# Patient Record
Sex: Female | Born: 1940 | Race: White | Hispanic: No | Marital: Married | Smoking: Never smoker
Health system: Southern US, Community
[De-identification: ages and names within clinical notes are randomized; demographics above are authoritative.]

## PROBLEM LIST (undated history)

## (undated) DIAGNOSIS — F329 Major depressive disorder, single episode, unspecified: Secondary | ICD-10-CM

## (undated) DIAGNOSIS — K219 Gastro-esophageal reflux disease without esophagitis: Secondary | ICD-10-CM

## (undated) DIAGNOSIS — M7072 Other bursitis of hip, left hip: Secondary | ICD-10-CM

## (undated) DIAGNOSIS — Z972 Presence of dental prosthetic device (complete) (partial): Secondary | ICD-10-CM

## (undated) DIAGNOSIS — E7800 Pure hypercholesterolemia, unspecified: Secondary | ICD-10-CM

## (undated) DIAGNOSIS — M7989 Other specified soft tissue disorders: Secondary | ICD-10-CM

## (undated) DIAGNOSIS — M810 Age-related osteoporosis without current pathological fracture: Secondary | ICD-10-CM

## (undated) DIAGNOSIS — F32A Depression, unspecified: Secondary | ICD-10-CM

## (undated) DIAGNOSIS — J302 Other seasonal allergic rhinitis: Secondary | ICD-10-CM

## (undated) DIAGNOSIS — J90 Pleural effusion, not elsewhere classified: Secondary | ICD-10-CM

## (undated) DIAGNOSIS — I10 Essential (primary) hypertension: Secondary | ICD-10-CM

## (undated) HISTORY — PX: OVARIAN CYST REMOVAL: SHX89

## (undated) HISTORY — PX: BREAST SURGERY: SHX581

## (undated) HISTORY — PX: TONSILLECTOMY: SUR1361

## (undated) HISTORY — PX: CATARACT EXTRACTION: SUR2

## (undated) HISTORY — PX: BREAST BIOPSY: SHX20

---

## 1998-06-16 DIAGNOSIS — I10 Essential (primary) hypertension: Secondary | ICD-10-CM | POA: Insufficient documentation

## 2004-01-10 ENCOUNTER — Ambulatory Visit: Payer: Self-pay | Admitting: Urology

## 2005-01-12 ENCOUNTER — Ambulatory Visit: Payer: Self-pay | Admitting: Family Medicine

## 2005-09-08 DIAGNOSIS — K5909 Other constipation: Secondary | ICD-10-CM | POA: Insufficient documentation

## 2005-09-16 ENCOUNTER — Ambulatory Visit: Payer: Self-pay | Admitting: Family Medicine

## 2005-10-04 ENCOUNTER — Ambulatory Visit: Payer: Self-pay | Admitting: Gastroenterology

## 2005-10-22 ENCOUNTER — Ambulatory Visit: Payer: Self-pay | Admitting: General Surgery

## 2005-10-22 ENCOUNTER — Other Ambulatory Visit: Payer: Self-pay

## 2005-10-29 ENCOUNTER — Ambulatory Visit: Payer: Self-pay | Admitting: General Surgery

## 2006-03-01 DIAGNOSIS — J90 Pleural effusion, not elsewhere classified: Secondary | ICD-10-CM

## 2006-03-01 HISTORY — PX: TOTAL HIP REVISION: SHX763

## 2006-03-01 HISTORY — PX: JOINT REPLACEMENT: SHX530

## 2006-03-01 HISTORY — DX: Pleural effusion, not elsewhere classified: J90

## 2006-07-07 ENCOUNTER — Ambulatory Visit: Payer: Self-pay | Admitting: Family Medicine

## 2007-03-02 DIAGNOSIS — K589 Irritable bowel syndrome without diarrhea: Secondary | ICD-10-CM | POA: Insufficient documentation

## 2007-07-11 ENCOUNTER — Ambulatory Visit: Payer: Self-pay | Admitting: Internal Medicine

## 2007-09-20 ENCOUNTER — Ambulatory Visit: Payer: Self-pay | Admitting: Family Medicine

## 2007-10-22 ENCOUNTER — Ambulatory Visit: Payer: Self-pay | Admitting: Family Medicine

## 2008-01-27 ENCOUNTER — Inpatient Hospital Stay: Payer: Self-pay | Admitting: Specialist

## 2008-08-26 ENCOUNTER — Ambulatory Visit: Payer: Self-pay | Admitting: Family Medicine

## 2008-11-11 ENCOUNTER — Ambulatory Visit: Payer: Self-pay | Admitting: Family Medicine

## 2009-05-28 ENCOUNTER — Ambulatory Visit: Payer: Self-pay | Admitting: Ophthalmology

## 2009-11-17 ENCOUNTER — Ambulatory Visit: Payer: Self-pay | Admitting: Family Medicine

## 2011-01-04 ENCOUNTER — Ambulatory Visit: Payer: Self-pay | Admitting: Family Medicine

## 2012-01-05 ENCOUNTER — Ambulatory Visit: Payer: Self-pay | Admitting: Family Medicine

## 2012-04-11 LAB — CBC AND DIFFERENTIAL
HEMATOCRIT: 40 % (ref 36–46)
HEMOGLOBIN: 13.3 g/dL (ref 12.0–16.0)
Platelets: 200 10*3/uL (ref 150–399)
WBC: 7.5 10*3/mL

## 2012-04-11 LAB — HEPATIC FUNCTION PANEL: ALT: 25 U/L (ref 7–35)

## 2012-11-15 DIAGNOSIS — Z96649 Presence of unspecified artificial hip joint: Secondary | ICD-10-CM | POA: Insufficient documentation

## 2013-01-05 DIAGNOSIS — G629 Polyneuropathy, unspecified: Secondary | ICD-10-CM | POA: Insufficient documentation

## 2013-03-23 ENCOUNTER — Ambulatory Visit: Payer: Self-pay | Admitting: Family Medicine

## 2013-05-02 LAB — LIPID PANEL
CHOLESTEROL: 185 mg/dL (ref 0–200)
HDL: 42 mg/dL (ref 35–70)
LDL CALC: 88 mg/dL
Triglycerides: 275 mg/dL — AB (ref 40–160)

## 2013-05-02 LAB — BASIC METABOLIC PANEL
BUN: 16 mg/dL (ref 4–21)
CREATININE: 0.7 mg/dL (ref 0.5–1.1)
Glucose: 102 mg/dL
Potassium: 4.4 mmol/L (ref 3.4–5.3)
Sodium: 142 mmol/L (ref 137–147)

## 2013-05-28 DIAGNOSIS — M47816 Spondylosis without myelopathy or radiculopathy, lumbar region: Secondary | ICD-10-CM | POA: Insufficient documentation

## 2014-06-12 ENCOUNTER — Ambulatory Visit
Admit: 2014-06-12 | Disposition: A | Discharge status: Home / Self Care | Payer: Self-pay | Attending: Family Medicine | Admitting: Family Medicine

## 2014-06-12 DIAGNOSIS — S32512A Fracture of superior rim of left pubis, initial encounter for closed fracture: Secondary | ICD-10-CM | POA: Diagnosis not present

## 2014-06-18 DIAGNOSIS — S32509A Unspecified fracture of unspecified pubis, initial encounter for closed fracture: Secondary | ICD-10-CM | POA: Insufficient documentation

## 2014-06-24 DIAGNOSIS — R2689 Other abnormalities of gait and mobility: Secondary | ICD-10-CM | POA: Diagnosis not present

## 2014-06-24 DIAGNOSIS — Z96642 Presence of left artificial hip joint: Secondary | ICD-10-CM | POA: Diagnosis not present

## 2014-06-24 DIAGNOSIS — I10 Essential (primary) hypertension: Secondary | ICD-10-CM | POA: Diagnosis not present

## 2014-06-24 DIAGNOSIS — M47816 Spondylosis without myelopathy or radiculopathy, lumbar region: Secondary | ICD-10-CM | POA: Diagnosis not present

## 2014-06-24 DIAGNOSIS — M810 Age-related osteoporosis without current pathological fracture: Secondary | ICD-10-CM | POA: Diagnosis not present

## 2014-06-24 DIAGNOSIS — M545 Low back pain: Secondary | ICD-10-CM | POA: Diagnosis not present

## 2014-06-24 DIAGNOSIS — S32502A Unspecified fracture of left pubis, initial encounter for closed fracture: Secondary | ICD-10-CM | POA: Diagnosis not present

## 2014-06-26 DIAGNOSIS — I10 Essential (primary) hypertension: Secondary | ICD-10-CM | POA: Diagnosis not present

## 2014-06-26 DIAGNOSIS — M47816 Spondylosis without myelopathy or radiculopathy, lumbar region: Secondary | ICD-10-CM | POA: Diagnosis not present

## 2014-06-26 DIAGNOSIS — M810 Age-related osteoporosis without current pathological fracture: Secondary | ICD-10-CM | POA: Diagnosis not present

## 2014-06-26 DIAGNOSIS — S32502A Unspecified fracture of left pubis, initial encounter for closed fracture: Secondary | ICD-10-CM | POA: Diagnosis not present

## 2014-06-26 DIAGNOSIS — R2689 Other abnormalities of gait and mobility: Secondary | ICD-10-CM | POA: Diagnosis not present

## 2014-06-26 DIAGNOSIS — Z96642 Presence of left artificial hip joint: Secondary | ICD-10-CM | POA: Diagnosis not present

## 2014-06-26 DIAGNOSIS — M545 Low back pain: Secondary | ICD-10-CM | POA: Diagnosis not present

## 2014-06-27 DIAGNOSIS — S3210XA Unspecified fracture of sacrum, initial encounter for closed fracture: Secondary | ICD-10-CM | POA: Insufficient documentation

## 2014-06-28 DIAGNOSIS — I10 Essential (primary) hypertension: Secondary | ICD-10-CM | POA: Diagnosis not present

## 2014-06-28 DIAGNOSIS — M47816 Spondylosis without myelopathy or radiculopathy, lumbar region: Secondary | ICD-10-CM | POA: Diagnosis not present

## 2014-06-28 DIAGNOSIS — S32502A Unspecified fracture of left pubis, initial encounter for closed fracture: Secondary | ICD-10-CM | POA: Diagnosis not present

## 2014-06-28 DIAGNOSIS — M545 Low back pain: Secondary | ICD-10-CM | POA: Diagnosis not present

## 2014-06-28 DIAGNOSIS — R2689 Other abnormalities of gait and mobility: Secondary | ICD-10-CM | POA: Diagnosis not present

## 2014-06-28 DIAGNOSIS — Z96642 Presence of left artificial hip joint: Secondary | ICD-10-CM | POA: Diagnosis not present

## 2014-06-28 DIAGNOSIS — M810 Age-related osteoporosis without current pathological fracture: Secondary | ICD-10-CM | POA: Diagnosis not present

## 2014-07-01 DIAGNOSIS — M545 Low back pain: Secondary | ICD-10-CM | POA: Diagnosis not present

## 2014-07-01 DIAGNOSIS — Z96642 Presence of left artificial hip joint: Secondary | ICD-10-CM | POA: Diagnosis not present

## 2014-07-01 DIAGNOSIS — M47816 Spondylosis without myelopathy or radiculopathy, lumbar region: Secondary | ICD-10-CM | POA: Diagnosis not present

## 2014-07-01 DIAGNOSIS — S32502A Unspecified fracture of left pubis, initial encounter for closed fracture: Secondary | ICD-10-CM | POA: Diagnosis not present

## 2014-07-01 DIAGNOSIS — M810 Age-related osteoporosis without current pathological fracture: Secondary | ICD-10-CM | POA: Diagnosis not present

## 2014-07-01 DIAGNOSIS — I10 Essential (primary) hypertension: Secondary | ICD-10-CM | POA: Diagnosis not present

## 2014-07-01 DIAGNOSIS — R2689 Other abnormalities of gait and mobility: Secondary | ICD-10-CM | POA: Diagnosis not present

## 2014-07-03 DIAGNOSIS — S32502A Unspecified fracture of left pubis, initial encounter for closed fracture: Secondary | ICD-10-CM | POA: Diagnosis not present

## 2014-07-03 DIAGNOSIS — M47816 Spondylosis without myelopathy or radiculopathy, lumbar region: Secondary | ICD-10-CM | POA: Diagnosis not present

## 2014-07-03 DIAGNOSIS — Z96642 Presence of left artificial hip joint: Secondary | ICD-10-CM | POA: Diagnosis not present

## 2014-07-03 DIAGNOSIS — M545 Low back pain: Secondary | ICD-10-CM | POA: Diagnosis not present

## 2014-07-03 DIAGNOSIS — M810 Age-related osteoporosis without current pathological fracture: Secondary | ICD-10-CM | POA: Diagnosis not present

## 2014-07-03 DIAGNOSIS — I10 Essential (primary) hypertension: Secondary | ICD-10-CM | POA: Diagnosis not present

## 2014-07-03 DIAGNOSIS — R2689 Other abnormalities of gait and mobility: Secondary | ICD-10-CM | POA: Diagnosis not present

## 2014-07-05 DIAGNOSIS — M810 Age-related osteoporosis without current pathological fracture: Secondary | ICD-10-CM | POA: Diagnosis not present

## 2014-07-05 DIAGNOSIS — S32502A Unspecified fracture of left pubis, initial encounter for closed fracture: Secondary | ICD-10-CM | POA: Diagnosis not present

## 2014-07-05 DIAGNOSIS — I10 Essential (primary) hypertension: Secondary | ICD-10-CM | POA: Diagnosis not present

## 2014-07-05 DIAGNOSIS — Z96642 Presence of left artificial hip joint: Secondary | ICD-10-CM | POA: Diagnosis not present

## 2014-07-05 DIAGNOSIS — R2689 Other abnormalities of gait and mobility: Secondary | ICD-10-CM | POA: Diagnosis not present

## 2014-07-05 DIAGNOSIS — M47816 Spondylosis without myelopathy or radiculopathy, lumbar region: Secondary | ICD-10-CM | POA: Diagnosis not present

## 2014-07-05 DIAGNOSIS — M545 Low back pain: Secondary | ICD-10-CM | POA: Diagnosis not present

## 2014-07-08 DIAGNOSIS — S32502A Unspecified fracture of left pubis, initial encounter for closed fracture: Secondary | ICD-10-CM | POA: Diagnosis not present

## 2014-07-08 DIAGNOSIS — M545 Low back pain: Secondary | ICD-10-CM | POA: Diagnosis not present

## 2014-07-08 DIAGNOSIS — M47816 Spondylosis without myelopathy or radiculopathy, lumbar region: Secondary | ICD-10-CM | POA: Diagnosis not present

## 2014-07-08 DIAGNOSIS — R2689 Other abnormalities of gait and mobility: Secondary | ICD-10-CM | POA: Diagnosis not present

## 2014-07-08 DIAGNOSIS — I10 Essential (primary) hypertension: Secondary | ICD-10-CM | POA: Diagnosis not present

## 2014-07-08 DIAGNOSIS — M810 Age-related osteoporosis without current pathological fracture: Secondary | ICD-10-CM | POA: Diagnosis not present

## 2014-07-08 DIAGNOSIS — Z96642 Presence of left artificial hip joint: Secondary | ICD-10-CM | POA: Diagnosis not present

## 2014-07-10 DIAGNOSIS — S32502A Unspecified fracture of left pubis, initial encounter for closed fracture: Secondary | ICD-10-CM | POA: Diagnosis not present

## 2014-07-10 DIAGNOSIS — I10 Essential (primary) hypertension: Secondary | ICD-10-CM | POA: Diagnosis not present

## 2014-07-10 DIAGNOSIS — R2689 Other abnormalities of gait and mobility: Secondary | ICD-10-CM | POA: Diagnosis not present

## 2014-07-10 DIAGNOSIS — Z96642 Presence of left artificial hip joint: Secondary | ICD-10-CM | POA: Diagnosis not present

## 2014-07-10 DIAGNOSIS — M545 Low back pain: Secondary | ICD-10-CM | POA: Diagnosis not present

## 2014-07-10 DIAGNOSIS — M47816 Spondylosis without myelopathy or radiculopathy, lumbar region: Secondary | ICD-10-CM | POA: Diagnosis not present

## 2014-07-10 DIAGNOSIS — M810 Age-related osteoporosis without current pathological fracture: Secondary | ICD-10-CM | POA: Diagnosis not present

## 2014-07-12 DIAGNOSIS — M810 Age-related osteoporosis without current pathological fracture: Secondary | ICD-10-CM | POA: Diagnosis not present

## 2014-07-12 DIAGNOSIS — R2689 Other abnormalities of gait and mobility: Secondary | ICD-10-CM | POA: Diagnosis not present

## 2014-07-12 DIAGNOSIS — M545 Low back pain: Secondary | ICD-10-CM | POA: Diagnosis not present

## 2014-07-12 DIAGNOSIS — I10 Essential (primary) hypertension: Secondary | ICD-10-CM | POA: Diagnosis not present

## 2014-07-12 DIAGNOSIS — Z96642 Presence of left artificial hip joint: Secondary | ICD-10-CM | POA: Diagnosis not present

## 2014-07-12 DIAGNOSIS — S32502A Unspecified fracture of left pubis, initial encounter for closed fracture: Secondary | ICD-10-CM | POA: Diagnosis not present

## 2014-07-12 DIAGNOSIS — M47816 Spondylosis without myelopathy or radiculopathy, lumbar region: Secondary | ICD-10-CM | POA: Diagnosis not present

## 2014-07-15 DIAGNOSIS — S32502A Unspecified fracture of left pubis, initial encounter for closed fracture: Secondary | ICD-10-CM | POA: Diagnosis not present

## 2014-07-15 DIAGNOSIS — M47816 Spondylosis without myelopathy or radiculopathy, lumbar region: Secondary | ICD-10-CM | POA: Diagnosis not present

## 2014-07-15 DIAGNOSIS — M545 Low back pain: Secondary | ICD-10-CM | POA: Diagnosis not present

## 2014-07-15 DIAGNOSIS — M810 Age-related osteoporosis without current pathological fracture: Secondary | ICD-10-CM | POA: Diagnosis not present

## 2014-07-15 DIAGNOSIS — R2689 Other abnormalities of gait and mobility: Secondary | ICD-10-CM | POA: Diagnosis not present

## 2014-07-15 DIAGNOSIS — Z96642 Presence of left artificial hip joint: Secondary | ICD-10-CM | POA: Diagnosis not present

## 2014-07-15 DIAGNOSIS — I10 Essential (primary) hypertension: Secondary | ICD-10-CM | POA: Diagnosis not present

## 2014-07-16 DIAGNOSIS — M810 Age-related osteoporosis without current pathological fracture: Secondary | ICD-10-CM | POA: Diagnosis not present

## 2014-07-16 DIAGNOSIS — Z96642 Presence of left artificial hip joint: Secondary | ICD-10-CM | POA: Diagnosis not present

## 2014-07-16 DIAGNOSIS — R2689 Other abnormalities of gait and mobility: Secondary | ICD-10-CM | POA: Diagnosis not present

## 2014-07-16 DIAGNOSIS — I10 Essential (primary) hypertension: Secondary | ICD-10-CM | POA: Diagnosis not present

## 2014-07-16 DIAGNOSIS — M47816 Spondylosis without myelopathy or radiculopathy, lumbar region: Secondary | ICD-10-CM | POA: Diagnosis not present

## 2014-07-16 DIAGNOSIS — M545 Low back pain: Secondary | ICD-10-CM | POA: Diagnosis not present

## 2014-07-16 DIAGNOSIS — S32502A Unspecified fracture of left pubis, initial encounter for closed fracture: Secondary | ICD-10-CM | POA: Diagnosis not present

## 2014-07-17 DIAGNOSIS — I10 Essential (primary) hypertension: Secondary | ICD-10-CM | POA: Diagnosis not present

## 2014-07-17 DIAGNOSIS — S32502A Unspecified fracture of left pubis, initial encounter for closed fracture: Secondary | ICD-10-CM | POA: Diagnosis not present

## 2014-07-17 DIAGNOSIS — M810 Age-related osteoporosis without current pathological fracture: Secondary | ICD-10-CM | POA: Diagnosis not present

## 2014-07-17 DIAGNOSIS — Z96642 Presence of left artificial hip joint: Secondary | ICD-10-CM | POA: Diagnosis not present

## 2014-07-17 DIAGNOSIS — R2689 Other abnormalities of gait and mobility: Secondary | ICD-10-CM | POA: Diagnosis not present

## 2014-07-17 DIAGNOSIS — M545 Low back pain: Secondary | ICD-10-CM | POA: Diagnosis not present

## 2014-07-17 DIAGNOSIS — M47816 Spondylosis without myelopathy or radiculopathy, lumbar region: Secondary | ICD-10-CM | POA: Diagnosis not present

## 2014-07-18 DIAGNOSIS — S32502A Unspecified fracture of left pubis, initial encounter for closed fracture: Secondary | ICD-10-CM | POA: Diagnosis not present

## 2014-07-18 DIAGNOSIS — M545 Low back pain: Secondary | ICD-10-CM | POA: Diagnosis not present

## 2014-07-18 DIAGNOSIS — Z96642 Presence of left artificial hip joint: Secondary | ICD-10-CM | POA: Diagnosis not present

## 2014-07-18 DIAGNOSIS — M810 Age-related osteoporosis without current pathological fracture: Secondary | ICD-10-CM | POA: Diagnosis not present

## 2014-07-18 DIAGNOSIS — R2689 Other abnormalities of gait and mobility: Secondary | ICD-10-CM | POA: Diagnosis not present

## 2014-07-18 DIAGNOSIS — I10 Essential (primary) hypertension: Secondary | ICD-10-CM | POA: Diagnosis not present

## 2014-07-18 DIAGNOSIS — M47816 Spondylosis without myelopathy or radiculopathy, lumbar region: Secondary | ICD-10-CM | POA: Diagnosis not present

## 2014-07-19 DIAGNOSIS — R2689 Other abnormalities of gait and mobility: Secondary | ICD-10-CM | POA: Diagnosis not present

## 2014-07-19 DIAGNOSIS — M47816 Spondylosis without myelopathy or radiculopathy, lumbar region: Secondary | ICD-10-CM | POA: Diagnosis not present

## 2014-07-19 DIAGNOSIS — I10 Essential (primary) hypertension: Secondary | ICD-10-CM | POA: Diagnosis not present

## 2014-07-19 DIAGNOSIS — M810 Age-related osteoporosis without current pathological fracture: Secondary | ICD-10-CM | POA: Diagnosis not present

## 2014-07-19 DIAGNOSIS — M545 Low back pain: Secondary | ICD-10-CM | POA: Diagnosis not present

## 2014-07-19 DIAGNOSIS — Z96642 Presence of left artificial hip joint: Secondary | ICD-10-CM | POA: Diagnosis not present

## 2014-07-19 DIAGNOSIS — S32502A Unspecified fracture of left pubis, initial encounter for closed fracture: Secondary | ICD-10-CM | POA: Diagnosis not present

## 2014-07-23 DIAGNOSIS — I10 Essential (primary) hypertension: Secondary | ICD-10-CM | POA: Diagnosis not present

## 2014-07-23 DIAGNOSIS — M545 Low back pain: Secondary | ICD-10-CM | POA: Diagnosis not present

## 2014-07-23 DIAGNOSIS — R2689 Other abnormalities of gait and mobility: Secondary | ICD-10-CM | POA: Diagnosis not present

## 2014-07-23 DIAGNOSIS — S32502A Unspecified fracture of left pubis, initial encounter for closed fracture: Secondary | ICD-10-CM | POA: Diagnosis not present

## 2014-07-23 DIAGNOSIS — M810 Age-related osteoporosis without current pathological fracture: Secondary | ICD-10-CM | POA: Diagnosis not present

## 2014-07-23 DIAGNOSIS — M47816 Spondylosis without myelopathy or radiculopathy, lumbar region: Secondary | ICD-10-CM | POA: Diagnosis not present

## 2014-07-23 DIAGNOSIS — Z96642 Presence of left artificial hip joint: Secondary | ICD-10-CM | POA: Diagnosis not present

## 2014-07-26 DIAGNOSIS — M545 Low back pain: Secondary | ICD-10-CM | POA: Diagnosis not present

## 2014-07-26 DIAGNOSIS — Z96642 Presence of left artificial hip joint: Secondary | ICD-10-CM | POA: Diagnosis not present

## 2014-07-26 DIAGNOSIS — S32502A Unspecified fracture of left pubis, initial encounter for closed fracture: Secondary | ICD-10-CM | POA: Diagnosis not present

## 2014-07-26 DIAGNOSIS — M47816 Spondylosis without myelopathy or radiculopathy, lumbar region: Secondary | ICD-10-CM | POA: Diagnosis not present

## 2014-07-26 DIAGNOSIS — I10 Essential (primary) hypertension: Secondary | ICD-10-CM | POA: Diagnosis not present

## 2014-07-26 DIAGNOSIS — R2689 Other abnormalities of gait and mobility: Secondary | ICD-10-CM | POA: Diagnosis not present

## 2014-07-26 DIAGNOSIS — M810 Age-related osteoporosis without current pathological fracture: Secondary | ICD-10-CM | POA: Diagnosis not present

## 2014-08-01 DIAGNOSIS — I10 Essential (primary) hypertension: Secondary | ICD-10-CM | POA: Diagnosis not present

## 2014-08-01 DIAGNOSIS — S32502A Unspecified fracture of left pubis, initial encounter for closed fracture: Secondary | ICD-10-CM | POA: Diagnosis not present

## 2014-08-01 DIAGNOSIS — M47816 Spondylosis without myelopathy or radiculopathy, lumbar region: Secondary | ICD-10-CM | POA: Diagnosis not present

## 2014-08-01 DIAGNOSIS — R2689 Other abnormalities of gait and mobility: Secondary | ICD-10-CM | POA: Diagnosis not present

## 2014-08-01 DIAGNOSIS — Z96642 Presence of left artificial hip joint: Secondary | ICD-10-CM | POA: Diagnosis not present

## 2014-08-01 DIAGNOSIS — M545 Low back pain: Secondary | ICD-10-CM | POA: Diagnosis not present

## 2014-08-01 DIAGNOSIS — M810 Age-related osteoporosis without current pathological fracture: Secondary | ICD-10-CM | POA: Diagnosis not present

## 2014-08-02 DIAGNOSIS — M545 Low back pain: Secondary | ICD-10-CM | POA: Diagnosis not present

## 2014-08-02 DIAGNOSIS — I10 Essential (primary) hypertension: Secondary | ICD-10-CM | POA: Diagnosis not present

## 2014-08-02 DIAGNOSIS — R2689 Other abnormalities of gait and mobility: Secondary | ICD-10-CM | POA: Diagnosis not present

## 2014-08-02 DIAGNOSIS — M47816 Spondylosis without myelopathy or radiculopathy, lumbar region: Secondary | ICD-10-CM | POA: Diagnosis not present

## 2014-08-02 DIAGNOSIS — S32502A Unspecified fracture of left pubis, initial encounter for closed fracture: Secondary | ICD-10-CM | POA: Diagnosis not present

## 2014-08-02 DIAGNOSIS — M810 Age-related osteoporosis without current pathological fracture: Secondary | ICD-10-CM | POA: Diagnosis not present

## 2014-08-02 DIAGNOSIS — Z96642 Presence of left artificial hip joint: Secondary | ICD-10-CM | POA: Diagnosis not present

## 2014-08-21 ENCOUNTER — Other Ambulatory Visit: Payer: Self-pay | Admitting: *Deleted

## 2014-08-21 MED ORDER — ZOLPIDEM TARTRATE ER 12.5 MG PO TBCR: 12.5000 mg | EXTENDED_RELEASE_TABLET | Freq: Every evening | ORAL | Status: DC | PRN

## 2014-08-21 NOTE — Telephone Encounter (Signed)
Refill request for Ambien CR 12.5 mg Last filled by MD on- 02/25/2014 #30 x5 Last Appt: 03/06/2014 Next Appt: none Please advise refill?

## 2014-08-22 ENCOUNTER — Other Ambulatory Visit: Payer: Self-pay | Admitting: *Deleted

## 2014-08-22 NOTE — Telephone Encounter (Addendum)
Refill request for HCTZ 25 mg Last filled by MD on- 06/21/2013 #90 x3  Refill request for Metroprolol 100 mg Last filled by MD on- 07/30/2013 #135 x3 Last Appt: 03/06/2014 Next Appt: none Please advise refill?

## 2014-08-23 MED ORDER — METOPROLOL SUCCINATE ER 100 MG PO TB24: 100.0000 mg | ORAL_TABLET | Freq: Every day | ORAL | Status: DC

## 2014-08-23 MED ORDER — HYDROCHLOROTHIAZIDE 25 MG PO TABS: 25.0000 mg | ORAL_TABLET | Freq: Every day | ORAL | Status: DC

## 2014-08-29 ENCOUNTER — Telehealth: Payer: Self-pay | Admitting: Family Medicine

## 2014-08-29 NOTE — Telephone Encounter (Signed)
Pt call saying [employer] said we need to call them at [phone] regarding her medication metoprolol 100.   They have a problem with the dose.  Thanks. tp.

## 2014-08-30 NOTE — Telephone Encounter (Signed)
Called pharmacy and spoke with pharmacy tech. She states that everything if good with this rx and there are no discrepancies or problems with dosage. They received the refill approval on 08/23/2014 and it was shipped out to the patient on 08/27/2014. Patient should be receiving it in the mail any day now.

## 2014-09-06 ENCOUNTER — Other Ambulatory Visit: Payer: Self-pay | Admitting: Family Medicine

## 2014-09-11 ENCOUNTER — Other Ambulatory Visit: Payer: Self-pay | Admitting: Family Medicine

## 2014-09-11 DIAGNOSIS — M810 Age-related osteoporosis without current pathological fracture: Secondary | ICD-10-CM | POA: Insufficient documentation

## 2014-10-23 DIAGNOSIS — H538 Other visual disturbances: Secondary | ICD-10-CM | POA: Diagnosis not present

## 2014-11-21 ENCOUNTER — Ambulatory Visit (INDEPENDENT_AMBULATORY_CARE_PROVIDER_SITE_OTHER): Payer: Medicare PPO

## 2014-11-21 DIAGNOSIS — Z23 Encounter for immunization: Secondary | ICD-10-CM | POA: Diagnosis not present

## 2014-11-21 DIAGNOSIS — H2511 Age-related nuclear cataract, right eye: Secondary | ICD-10-CM | POA: Diagnosis not present

## 2014-12-09 DIAGNOSIS — H2511 Age-related nuclear cataract, right eye: Secondary | ICD-10-CM | POA: Diagnosis not present

## 2014-12-11 ENCOUNTER — Encounter: Payer: Self-pay | Admitting: *Deleted

## 2014-12-16 NOTE — Discharge Instructions (Signed)

## 2014-12-17 ENCOUNTER — Other Ambulatory Visit: Payer: Self-pay | Admitting: Family Medicine

## 2014-12-17 ENCOUNTER — Other Ambulatory Visit: Payer: Self-pay | Admitting: *Deleted

## 2014-12-17 MED ORDER — LANSOPRAZOLE 30 MG PO CPDR: 30.0000 mg | DELAYED_RELEASE_CAPSULE | Freq: Every day | ORAL | Status: DC

## 2014-12-17 MED ORDER — ZOLPIDEM TARTRATE ER 12.5 MG PO TBCR: 12.5000 mg | EXTENDED_RELEASE_TABLET | Freq: Every evening | ORAL | Status: DC | PRN

## 2014-12-17 NOTE — Telephone Encounter (Signed)
Rx called in to pharmacy. 

## 2014-12-17 NOTE — Telephone Encounter (Signed)
Please call in zolpidem  

## 2014-12-17 NOTE — Telephone Encounter (Signed)
Patient wants Zolpidem sent to [facility] and Lansoprazole sent to [facility].

## 2014-12-18 ENCOUNTER — Ambulatory Visit
Admission: RE | Admit: 2014-12-18 | Discharge: 2014-12-18 | Disposition: A | Discharge status: Home / Self Care | Payer: Medicare PPO | Source: Ambulatory Visit | Attending: Ophthalmology | Admitting: Ophthalmology

## 2014-12-18 ENCOUNTER — Ambulatory Visit: Payer: Medicare PPO | Admitting: Anesthesiology

## 2014-12-18 ENCOUNTER — Encounter
Admission: RE | Disposition: A | Discharge status: Home / Self Care | Payer: Self-pay | Source: Ambulatory Visit | Attending: Ophthalmology

## 2014-12-18 ENCOUNTER — Encounter: Payer: Self-pay | Admitting: *Deleted

## 2014-12-18 DIAGNOSIS — Z9049 Acquired absence of other specified parts of digestive tract: Secondary | ICD-10-CM | POA: Diagnosis not present

## 2014-12-18 DIAGNOSIS — K219 Gastro-esophageal reflux disease without esophagitis: Secondary | ICD-10-CM | POA: Diagnosis not present

## 2014-12-18 DIAGNOSIS — Z888 Allergy status to other drugs, medicaments and biological substances status: Secondary | ICD-10-CM | POA: Diagnosis not present

## 2014-12-18 DIAGNOSIS — M810 Age-related osteoporosis without current pathological fracture: Secondary | ICD-10-CM | POA: Diagnosis not present

## 2014-12-18 DIAGNOSIS — M7989 Other specified soft tissue disorders: Secondary | ICD-10-CM | POA: Insufficient documentation

## 2014-12-18 DIAGNOSIS — E7800 Pure hypercholesterolemia, unspecified: Secondary | ICD-10-CM | POA: Insufficient documentation

## 2014-12-18 DIAGNOSIS — F329 Major depressive disorder, single episode, unspecified: Secondary | ICD-10-CM | POA: Insufficient documentation

## 2014-12-18 DIAGNOSIS — I10 Essential (primary) hypertension: Secondary | ICD-10-CM | POA: Insufficient documentation

## 2014-12-18 DIAGNOSIS — Z96642 Presence of left artificial hip joint: Secondary | ICD-10-CM | POA: Insufficient documentation

## 2014-12-18 DIAGNOSIS — Z881 Allergy status to other antibiotic agents status: Secondary | ICD-10-CM | POA: Insufficient documentation

## 2014-12-18 DIAGNOSIS — Z9842 Cataract extraction status, left eye: Secondary | ICD-10-CM | POA: Diagnosis not present

## 2014-12-18 DIAGNOSIS — H2511 Age-related nuclear cataract, right eye: Secondary | ICD-10-CM | POA: Insufficient documentation

## 2014-12-18 HISTORY — DX: Other specified soft tissue disorders: M79.89

## 2014-12-18 HISTORY — DX: Major depressive disorder, single episode, unspecified: F32.9

## 2014-12-18 HISTORY — DX: Depression, unspecified: F32.A

## 2014-12-18 HISTORY — DX: Other seasonal allergic rhinitis: J30.2

## 2014-12-18 HISTORY — DX: Other bursitis of hip, left hip: M70.72

## 2014-12-18 HISTORY — PX: CATARACT EXTRACTION W/PHACO: SHX586

## 2014-12-18 HISTORY — DX: Essential (primary) hypertension: I10

## 2014-12-18 HISTORY — DX: Gastro-esophageal reflux disease without esophagitis: K21.9

## 2014-12-18 HISTORY — DX: Pleural effusion, not elsewhere classified: J90

## 2014-12-18 HISTORY — DX: Pure hypercholesterolemia, unspecified: E78.00

## 2014-12-18 HISTORY — DX: Presence of dental prosthetic device (complete) (partial): Z97.2

## 2014-12-18 HISTORY — DX: Age-related osteoporosis without current pathological fracture: M81.0

## 2014-12-18 SURGERY — PHACOEMULSIFICATION, CATARACT, WITH IOL INSERTION
Anesthesia: Monitor Anesthesia Care | Laterality: Right | Wound class: Clean

## 2014-12-18 MED ORDER — FENTANYL CITRATE (PF) 100 MCG/2ML IJ SOLN
INTRAMUSCULAR | Status: DC | PRN
  Administered 2014-12-18: 50 ug via INTRAVENOUS

## 2014-12-18 MED ORDER — NA HYALUR & NA CHOND-NA HYALUR 0.4-0.35 ML IO KIT
PACK | INTRAOCULAR | Status: DC | PRN
  Administered 2014-12-18: 1 mL via INTRAOCULAR

## 2014-12-18 MED ORDER — POVIDONE-IODINE 5 % OP SOLN
1.0000 "application " | OPHTHALMIC | Status: DC | PRN
  Administered 2014-12-18: 1 via OPHTHALMIC

## 2014-12-18 MED ORDER — ARMC OPHTHALMIC DILATING GEL
1.0000 "application " | OPHTHALMIC | Status: DC | PRN
  Administered 2014-12-18 (×2): 1 via OPHTHALMIC

## 2014-12-18 MED ORDER — TIMOLOL MALEATE 0.5 % OP SOLN
OPHTHALMIC | Status: DC | PRN
  Administered 2014-12-18: 1 [drp] via OPHTHALMIC

## 2014-12-18 MED ORDER — MOXIFLOXACIN HCL 0.5 % OP SOLN
OPHTHALMIC | Status: DC | PRN
  Administered 2014-12-18: 1 [drp] via OPHTHALMIC

## 2014-12-18 MED ORDER — BRIMONIDINE TARTRATE 0.2 % OP SOLN
OPHTHALMIC | Status: DC | PRN
  Administered 2014-12-18: 1 [drp] via OPHTHALMIC

## 2014-12-18 MED ORDER — BSS IO SOLN
INTRAOCULAR | Status: DC | PRN
  Administered 2014-12-18: 75 mL via OPHTHALMIC

## 2014-12-18 MED ORDER — MIDAZOLAM HCL 2 MG/2ML IJ SOLN
INTRAMUSCULAR | Status: DC | PRN
  Administered 2014-12-18: 1 mg via INTRAVENOUS

## 2014-12-18 MED ORDER — TETRACAINE HCL 0.5 % OP SOLN
1.0000 [drp] | OPHTHALMIC | Status: DC | PRN
  Administered 2014-12-18: 1 [drp] via OPHTHALMIC

## 2014-12-18 SURGICAL SUPPLY — 26 items
CANNULA ANT/CHMB 27GA (MISCELLANEOUS) ×3 IMPLANT
GLOVE SURG LX 7.5 STRW (GLOVE) ×2
GLOVE SURG LX STRL 7.5 STRW (GLOVE) ×1 IMPLANT
GLOVE SURG TRIUMPH 8.0 PF LTX (GLOVE) ×3 IMPLANT
GOWN STRL REUS W/ TWL LRG LVL3 (GOWN DISPOSABLE) ×2 IMPLANT
GOWN STRL REUS W/TWL LRG LVL3 (GOWN DISPOSABLE) ×4
LENS IOL ACRSF IQ PC 22.0 (Intraocular Lens) ×1 IMPLANT
LENS IOL ACRYSOF IQ POST 22.0 (Intraocular Lens) ×3 IMPLANT
MARKER SKIN SURG W/RULER VIO (MISCELLANEOUS) ×3 IMPLANT
NDL RETROBULBAR .5 NSTRL (NEEDLE) IMPLANT
NEEDLE FILTER BLUNT 18X 1/2SAF (NEEDLE) ×2
NEEDLE FILTER BLUNT 18X1 1/2 (NEEDLE) ×1 IMPLANT
PACK CATARACT BRASINGTON (MISCELLANEOUS) ×3 IMPLANT
PACK EYE AFTER SURG (MISCELLANEOUS) ×3 IMPLANT
PACK OPTHALMIC (MISCELLANEOUS) ×3 IMPLANT
RING MALYGIN 7.0 (MISCELLANEOUS) IMPLANT
SUT ETHILON 10-0 CS-B-6CS-B-6 (SUTURE)
SUT VICRYL  9 0 (SUTURE)
SUT VICRYL 9 0 (SUTURE) IMPLANT
SUTURE EHLN 10-0 CS-B-6CS-B-6 (SUTURE) IMPLANT
SYR 3ML LL SCALE MARK (SYRINGE) ×3 IMPLANT
SYR 5ML LL (SYRINGE) IMPLANT
SYR TB 1ML LUER SLIP (SYRINGE) ×3 IMPLANT
WATER STERILE IRR 250ML POUR (IV SOLUTION) ×3 IMPLANT
WATER STERILE IRR 500ML POUR (IV SOLUTION) IMPLANT
WIPE NON LINTING 3.25X3.25 (MISCELLANEOUS) ×3 IMPLANT

## 2014-12-18 NOTE — Op Note (Signed)
LOCATION:  [facility]   PREOPERATIVE DIAGNOSIS:    Nuclear sclerotic cataract right eye. H25.11   POSTOPERATIVE DIAGNOSIS:  Nuclear sclerotic cataract right eye.     PROCEDURE:  Phacoemusification with posterior chamber intraocular lens placement of the right eye   LENS:   Implant Name Type Inv. Item Serial No. Manufacturer Lot No. LRB No. Used  IMPLANT LENS - [id number] Intraocular Lens IMPLANT LENS [id number] ALCON   Right 1     [id number] 22.0 D   ULTRASOUND TIME: 17 % of 1 minutes, 23 seconds.  CDE 14.0   SURGEON:  Deirdre Evenerhadwick R. Shanikqua Zarzycki, MD   ANESTHESIA:  Topical with tetracaine drops and 2% Xylocaine jelly.   COMPLICATIONS:  None.   DESCRIPTION OF PROCEDURE:  The patient was identified in the holding room and transported to the operating room and placed in the supine position under the operating microscope.  The right eye was identified as the operative eye and it was prepped and draped in the usual sterile ophthalmic fashion.   A 1 millimeter clear-corneal paracentesis was made at the 12:00 position.  The anterior chamber was filled with Viscoat viscoelastic.  A 2.4 millimeter keratome was used to make a near-clear corneal incision at the 9:00 position.  A curvilinear capsulorrhexis was made with a cystotome and capsulorrhexis forceps.  Balanced salt solution was used to hydrodissect and hydrodelineate the nucleus.   Phacoemulsification was then used in stop and chop fashion to remove the lens nucleus and epinucleus.  The remaining cortex was then removed using the irrigation and aspiration handpiece. Provisc was then placed into the capsular bag to distend it for lens placement.  A lens was then injected into the capsular bag.  The remaining viscoelastic was aspirated.   Wounds were hydrated with balanced salt solution.  The anterior chamber was inflated to a physiologic pressure with balanced salt solution.  No wound leaks were noted. Vigamox 0.2 ml of a 1mg  per ml  solution was injected into the anterior chamber for a dose of 0.2 mg of intracameral antibiotic at the completion of the case.   Timolol and Brimonidine drops were applied to the eye.  The patient was taken to the recovery room in stable condition without complications of anesthesia or surgery.   Malisa Ruggiero 12/18/2014, 9:40 AM

## 2014-12-18 NOTE — Anesthesia Postprocedure Evaluation (Signed)
  Anesthesia Post-op Note  Patient: Erica Smith  Procedure(s) Performed: Procedure(s): CATARACT EXTRACTION PHACO AND INTRAOCULAR LENS PLACEMENT (IOC) (Right)  Anesthesia type:MAC  Patient location: PACU  Post pain: Pain level controlled  Post assessment: Post-op Vital signs reviewed, Patient's Cardiovascular Status Stable, Respiratory Function Stable, Patent Airway and No signs of Nausea or vomiting  Post vital signs: Reviewed and stable  Last Vitals:  Filed Vitals:   12/18/14 0946  BP: 164/70  Pulse: 75  Temp:   Resp: 21    Level of consciousness: awake, alert  and patient cooperative  Complications: No apparent anesthesia complications

## 2014-12-18 NOTE — Anesthesia Procedure Notes (Signed)
Procedure Name: MAC Performed by: Zadin Lange Pre-anesthesia Checklist: Patient identified, Emergency Drugs available, Suction available, Patient being monitored and Timeout performed Patient Re-evaluated:Patient Re-evaluated prior to induction    

## 2014-12-18 NOTE — Transfer of Care (Signed)
Immediate Anesthesia Transfer of Care Note  Patient: Erica Smith  Procedure(s) Performed: Procedure(s): CATARACT EXTRACTION PHACO AND INTRAOCULAR LENS PLACEMENT (IOC) (Right)  Patient Location: PACU  Anesthesia Type: MAC  Level of Consciousness: awake, alert  and patient cooperative  Airway and Oxygen Therapy: Patient Spontanous Breathing and Patient connected to supplemental oxygen  Post-op Assessment: Post-op Vital signs reviewed, Patient's Cardiovascular Status Stable, Respiratory Function Stable, Patent Airway and No signs of Nausea or vomiting  Post-op Vital Signs: Reviewed and stable  Complications: No apparent anesthesia complications

## 2014-12-18 NOTE — Anesthesia Preprocedure Evaluation (Signed)
Anesthesia Evaluation  Patient identified by MRN, date of birth, ID band Patient awake    Reviewed: Allergy & Precautions, H&P , Patient's Chart, lab work & pertinent test results  Airway Mallampati: II  TM Distance: >3 FB Neck ROM: full    Dental no notable dental hx.    Pulmonary neg pulmonary ROS,    Pulmonary exam normal        Cardiovascular hypertension, Normal cardiovascular exam     Neuro/Psych    GI/Hepatic Neg liver ROS, GERD  Medicated,  Endo/Other  negative endocrine ROS  Renal/GU negative Renal ROS     Musculoskeletal   Abdominal   Peds  Hematology negative hematology ROS (+)   Anesthesia Other Findings   Reproductive/Obstetrics negative OB ROS                             Anesthesia Physical Anesthesia Plan  ASA: II  Anesthesia Plan: MAC   Post-op Pain Management:    Induction:   Airway Management Planned:   Additional Equipment:   Intra-op Plan:   Post-operative Plan:   Informed Consent: I have reviewed the patients History and Physical, chart, labs and discussed the procedure including the risks, benefits and alternatives for the proposed anesthesia with the patient or authorized representative who has indicated his/her understanding and acceptance.     Plan Discussed with: CRNA  Anesthesia Plan Comments:         Anesthesia Quick Evaluation

## 2014-12-18 NOTE — H&P (Signed)
  The History and Physical notes are on paper, have been signed, and are to be scanned. The patient remains stable and unchanged from the H&P.   Previous H&P reviewed, patient examined, and there are no changes.  Erica Smith 12/18/2014 8:41 AM

## 2014-12-19 ENCOUNTER — Encounter: Payer: Self-pay | Admitting: Ophthalmology

## 2014-12-23 ENCOUNTER — Telehealth: Payer: Self-pay | Admitting: Family Medicine

## 2014-12-23 NOTE — Telephone Encounter (Signed)
Please advise 

## 2014-12-23 NOTE — Telephone Encounter (Signed)
Pt contacted office for refill request on the following medications: zolpidem (AMBIEN CR) 12.5 MG CR tablet to [facility]. Pt stated this was sent in for 30 day supply and she needs it for a 90 day supply b/c it is cheaper that way. Pt is going out of town Wednesday 12/25/14 and [facility] before [facility] leaves. Thanks TNP

## 2014-12-24 MED ORDER — ZOLPIDEM TARTRATE ER 12.5 MG PO TBCR: 12.5000 mg | EXTENDED_RELEASE_TABLET | Freq: Every evening | ORAL | Status: DC | PRN

## 2014-12-24 NOTE — Telephone Encounter (Signed)
Please call in zolpidem  

## 2014-12-24 NOTE — Telephone Encounter (Signed)
Rx has already been phoned in on 12/17/2014. Called pharmacy who stated that rx is on hold because it has reached the limited quantity for the year. Patient's is only allowed to have #90 Zolpidem per year. Prior Berkley Harveyauth is needed to extend the quantity. [phone]. Patient is aware.

## 2014-12-27 ENCOUNTER — Telehealth: Payer: Self-pay | Admitting: Family Medicine

## 2014-12-27 ENCOUNTER — Other Ambulatory Visit: Payer: Self-pay | Admitting: Family Medicine

## 2014-12-27 MED ORDER — ZOLPIDEM TARTRATE ER 12.5 MG PO TBCR: 12.5000 mg | EXTENDED_RELEASE_TABLET | Freq: Every evening | ORAL | Status: DC | PRN

## 2014-12-27 NOTE — Telephone Encounter (Signed)
Pt is here in the office and was asking about a prior auth for zolpidem (AMBIEN CR) 12.5 MG CR tablet that is still being processed as far as I know. Pt would like to get a written RX for the medication that she could take to her pharmacy for 10 pills today. Pt is hoping this will last until it is approved. Please advise. Thanks TNP

## 2014-12-27 NOTE — Telephone Encounter (Signed)
Spoke with Dr. Sherrie MustacheFisher since pt was in the office and he printed RX for 10 pills.  Thanks TNP

## 2014-12-30 DIAGNOSIS — Z09 Encounter for follow-up examination after completed treatment for conditions other than malignant neoplasm: Secondary | ICD-10-CM | POA: Diagnosis not present

## 2014-12-30 DIAGNOSIS — L821 Other seborrheic keratosis: Secondary | ICD-10-CM | POA: Diagnosis not present

## 2014-12-30 DIAGNOSIS — Z1283 Encounter for screening for malignant neoplasm of skin: Secondary | ICD-10-CM | POA: Diagnosis not present

## 2014-12-30 DIAGNOSIS — Z872 Personal history of diseases of the skin and subcutaneous tissue: Secondary | ICD-10-CM | POA: Diagnosis not present

## 2015-02-10 DIAGNOSIS — M5412 Radiculopathy, cervical region: Secondary | ICD-10-CM | POA: Diagnosis not present

## 2015-02-10 DIAGNOSIS — M542 Cervicalgia: Secondary | ICD-10-CM | POA: Diagnosis not present

## 2015-02-10 DIAGNOSIS — M25511 Pain in right shoulder: Secondary | ICD-10-CM | POA: Diagnosis not present

## 2015-02-18 DIAGNOSIS — M542 Cervicalgia: Secondary | ICD-10-CM | POA: Diagnosis not present

## 2015-02-18 DIAGNOSIS — M25511 Pain in right shoulder: Secondary | ICD-10-CM | POA: Diagnosis not present

## 2015-02-27 DIAGNOSIS — M25511 Pain in right shoulder: Secondary | ICD-10-CM | POA: Diagnosis not present

## 2015-02-27 DIAGNOSIS — M542 Cervicalgia: Secondary | ICD-10-CM | POA: Diagnosis not present

## 2015-03-04 DIAGNOSIS — M25511 Pain in right shoulder: Secondary | ICD-10-CM | POA: Diagnosis not present

## 2015-03-04 DIAGNOSIS — M542 Cervicalgia: Secondary | ICD-10-CM | POA: Diagnosis not present

## 2015-03-07 DIAGNOSIS — M542 Cervicalgia: Secondary | ICD-10-CM | POA: Diagnosis not present

## 2015-03-07 DIAGNOSIS — M25511 Pain in right shoulder: Secondary | ICD-10-CM | POA: Diagnosis not present

## 2015-03-12 DIAGNOSIS — M542 Cervicalgia: Secondary | ICD-10-CM | POA: Diagnosis not present

## 2015-03-12 DIAGNOSIS — M25511 Pain in right shoulder: Secondary | ICD-10-CM | POA: Diagnosis not present

## 2015-03-17 DIAGNOSIS — M542 Cervicalgia: Secondary | ICD-10-CM | POA: Diagnosis not present

## 2015-03-17 DIAGNOSIS — M25511 Pain in right shoulder: Secondary | ICD-10-CM | POA: Diagnosis not present

## 2015-03-31 DIAGNOSIS — M542 Cervicalgia: Secondary | ICD-10-CM | POA: Diagnosis not present

## 2015-03-31 DIAGNOSIS — M25511 Pain in right shoulder: Secondary | ICD-10-CM | POA: Diagnosis not present

## 2015-03-31 DIAGNOSIS — M5412 Radiculopathy, cervical region: Secondary | ICD-10-CM | POA: Diagnosis not present

## 2015-03-31 DIAGNOSIS — G8929 Other chronic pain: Secondary | ICD-10-CM | POA: Diagnosis not present

## 2015-04-08 DIAGNOSIS — M25511 Pain in right shoulder: Secondary | ICD-10-CM | POA: Diagnosis not present

## 2015-04-08 DIAGNOSIS — M542 Cervicalgia: Secondary | ICD-10-CM | POA: Diagnosis not present

## 2015-04-14 DIAGNOSIS — M25511 Pain in right shoulder: Secondary | ICD-10-CM | POA: Diagnosis not present

## 2015-04-14 DIAGNOSIS — M542 Cervicalgia: Secondary | ICD-10-CM | POA: Diagnosis not present

## 2015-04-18 DIAGNOSIS — M25511 Pain in right shoulder: Secondary | ICD-10-CM | POA: Diagnosis not present

## 2015-04-18 DIAGNOSIS — M542 Cervicalgia: Secondary | ICD-10-CM | POA: Diagnosis not present

## 2015-04-24 ENCOUNTER — Other Ambulatory Visit: Payer: Self-pay | Admitting: *Deleted

## 2015-04-24 MED ORDER — ZOLPIDEM TARTRATE ER 12.5 MG PO TBCR: 12.5000 mg | EXTENDED_RELEASE_TABLET | Freq: Every evening | ORAL | Status: DC | PRN

## 2015-04-24 NOTE — Telephone Encounter (Signed)
Please call in zolpidem  

## 2015-04-25 DIAGNOSIS — M25511 Pain in right shoulder: Secondary | ICD-10-CM | POA: Diagnosis not present

## 2015-04-25 DIAGNOSIS — M542 Cervicalgia: Secondary | ICD-10-CM | POA: Diagnosis not present

## 2015-04-25 NOTE — Telephone Encounter (Signed)
Rx called in to pharmacy. 

## 2015-05-05 DIAGNOSIS — M25511 Pain in right shoulder: Secondary | ICD-10-CM | POA: Diagnosis not present

## 2015-05-05 DIAGNOSIS — M542 Cervicalgia: Secondary | ICD-10-CM | POA: Diagnosis not present

## 2015-05-09 DIAGNOSIS — M25511 Pain in right shoulder: Secondary | ICD-10-CM | POA: Diagnosis not present

## 2015-05-09 DIAGNOSIS — M542 Cervicalgia: Secondary | ICD-10-CM | POA: Diagnosis not present

## 2015-05-14 DIAGNOSIS — M25511 Pain in right shoulder: Secondary | ICD-10-CM | POA: Diagnosis not present

## 2015-05-14 DIAGNOSIS — M542 Cervicalgia: Secondary | ICD-10-CM | POA: Diagnosis not present

## 2015-05-16 DIAGNOSIS — M25511 Pain in right shoulder: Secondary | ICD-10-CM | POA: Diagnosis not present

## 2015-05-16 DIAGNOSIS — M542 Cervicalgia: Secondary | ICD-10-CM | POA: Diagnosis not present

## 2015-05-26 ENCOUNTER — Telehealth: Payer: Self-pay

## 2015-05-26 NOTE — Telephone Encounter (Signed)
Patient called wanting to know why her Ambien is only for a qty of 20 pills. Patient states she takes thi medication every night and would like 30 tablets. Patient has not been able to come in for a follow up appointment due to her husband being in and out of the hospital. She states if Dr. Sherrie MustacheFisher needs her to come in for a follow up appointment, she could come in some time next week. Patient does not need a refill right now but would like future refills to be a qty of 30 pills.

## 2015-05-26 NOTE — Telephone Encounter (Signed)
Her insurance only covers 20 per month because it is not consider safe to take it more often.

## 2015-05-26 NOTE — Telephone Encounter (Signed)
Patient reports that she needs to take Ambien every night to help her sleep. She reports that she pays out of pocket for the remainder of pills that is not covered. Patient is requesting 30 pills to be sent into the pharmacy. She does not need a refill at this moment, but she will need one in the next week. Thanks!

## 2015-05-27 MED ORDER — ZOLPIDEM TARTRATE ER 12.5 MG PO TBCR: 12.5000 mg | EXTENDED_RELEASE_TABLET | Freq: Every evening | ORAL | Status: DC | PRN

## 2015-05-27 NOTE — Telephone Encounter (Signed)
OK to call in new Rx for 30 tablets.

## 2015-05-27 NOTE — Telephone Encounter (Signed)
Rx called in to pharmacy. 

## 2015-06-03 DIAGNOSIS — M25511 Pain in right shoulder: Secondary | ICD-10-CM | POA: Diagnosis not present

## 2015-06-03 DIAGNOSIS — M542 Cervicalgia: Secondary | ICD-10-CM | POA: Diagnosis not present

## 2015-06-04 ENCOUNTER — Other Ambulatory Visit: Payer: Self-pay | Admitting: Family Medicine

## 2015-06-10 DIAGNOSIS — M25511 Pain in right shoulder: Secondary | ICD-10-CM | POA: Diagnosis not present

## 2015-06-10 DIAGNOSIS — M542 Cervicalgia: Secondary | ICD-10-CM | POA: Diagnosis not present

## 2015-06-22 ENCOUNTER — Ambulatory Visit
Admission: EM | Admit: 2015-06-22 | Discharge: 2015-06-22 | Disposition: A | Discharge status: Home / Self Care | Payer: Medicare PPO | Attending: Family Medicine | Admitting: Family Medicine

## 2015-06-22 DIAGNOSIS — J069 Acute upper respiratory infection, unspecified: Secondary | ICD-10-CM

## 2015-06-22 DIAGNOSIS — J302 Other seasonal allergic rhinitis: Secondary | ICD-10-CM | POA: Diagnosis not present

## 2015-06-22 NOTE — ED Provider Notes (Signed)
CSN: [id number]     Arrival date & time 06/22/15  1145 History   First MD Initiated Contact with Patient 06/22/15 1325     Chief Complaint  Patient presents with  . Sinusitis   HPI  Erica Smith is a pleasant 75 y.o. female who presents with what she felt was allergies about 6-7 days ago. She has had itchy watery eyes, nasal congestion and sinus congestion. She says she is blowing out green and yellowish not and sometimes blood. She denies any fever. She has postnasal drip. She has a cough that is worse at night and first thing in the morning. She denies any ill contacts. She did get a flu shot this year. She has felt a little fatigued. Has allergy eyedrops that she uses, Nasacort daily and she has been trying some Mucinex. Things seem to help a little but not completely. She has history of allergic rhinitis.  Past Medical History  Diagnosis Date  . GERD (gastroesophageal reflux disease)   . Hypertension   . Pleural effusion 2008    after hip surgery  . Seasonal allergies   . Hypercholesteremia   . Depression   . Osteoporosis   . Bursitis of left hip   . Wears dentures     partial upper  . Swelling of left lower extremity    Past Surgical History  Procedure Laterality Date  . Cataract extraction Left   . Tonsillectomy    . Joint replacement Left 2008    hip  . Breast surgery Bilateral     cyst removal  . Total hip revision Left 2008  . Cataract extraction w/phaco Right 12/18/2014    Procedure: CATARACT EXTRACTION PHACO AND INTRAOCULAR LENS PLACEMENT (IOC);  Surgeon: Lockie Molahadwick Brasington, MD;  [facility];  Service: Ophthalmology;  Laterality: Right;   History reviewed. No pertinent family history. Social History  Substance Use Topics  . Smoking status: Never Smoker   . Smokeless tobacco: None  . Alcohol Use: No   OB History    No data available     Review of Systems  Constitutional: Positive for fatigue.  HENT: Positive for congestion, postnasal  drip, rhinorrhea, sinus pressure and sneezing. Negative for ear pain, nosebleeds and sore throat.   Respiratory: Negative.   Cardiovascular: Negative.   Gastrointestinal: Negative.   Allergic/Immunologic: Positive for environmental allergies.  Neurological: Negative.   Psychiatric/Behavioral: Negative.     Allergies  Alendronate sodium; Fish oil; Fluticasone propionate; Lovastatin; Bextra; Celebrex; Ceftin; Phazyme; and Sulfamethoxazole-trimethoprim  Home Medications   Prior to Admission medications   Medication Sig Start Date End Date Taking? Authorizing Provider  aspirin 81 MG tablet  08/21/08  Yes Historical Provider, MD  Calcium Carb-Cholecalciferol (CALTRATE 600+D) 600-800 MG-UNIT TABS Take by mouth.   Yes Historical Provider, MD  Glucosamine-Chondroit-Vit C-Mn (GLUCOSAMINE CHONDR 1500 COMPLX) CAPS Take by mouth.   Yes Historical Provider, MD  hydrochlorothiazide (HYDRODIURIL) 25 MG tablet Take 1 tablet (25 mg total) by mouth daily. 08/23/14  Yes Malva Limesonald E Fisher, MD  ibandronate (BONIVA) 150 MG tablet TAKE 1 TABLET  MONTHLY 09/12/14  Yes Malva Limesonald E Fisher, MD  lansoprazole (PREVACID) 30 MG capsule Take 1 capsule (30 mg total) by mouth daily. 12/17/14  Yes Malva Limesonald E Fisher, MD  lubiprostone (AMITIZA) 8 MCG capsule Take by mouth. 07/30/14  Yes Historical Provider, MD  metoprolol succinate (TOPROL XL) 100 MG 24 hr tablet Take 1 tablet (100 mg total) by mouth daily. 08/23/14  Yes Malva Limesonald E Fisher, MD  MULTIPLE VITAMIN PO  08/21/08  Yes Historical Provider, MD  nortriptyline (PAMELOR) 50 MG capsule TAKE 2 CAPSULES AT BEDTIME 09/06/14  Yes Malva Limes, MD  simvastatin (ZOCOR) 40 MG tablet TAKE 1 TABLET EVERY DAY 06/04/15  Yes Malva Limes, MD  triamcinolone (NASACORT AQ) 55 MCG/ACT AERO nasal inhaler Place into the nose. 07/24/13  Yes Historical Provider, MD  zolpidem (AMBIEN CR) 12.5 MG CR tablet Take 1 tablet (12.5 mg total) by mouth at bedtime as needed for sleep. 05/27/15  Yes Malva Limes,  MD   Meds Ordered and Administered this Visit  Medications - No data to display  BP 157/63 mmHg  Pulse 86  Temp(Src) 98.1 F (36.7 C) (Oral)  Resp 16  Ht  (1.651 m)  Wt 165 lb (74.844 kg)  BMI 27.46 kg/m2  SpO2 100% No data found.   Physical Exam  Constitutional: She is oriented to person, place, and time. She appears well-developed and well-nourished. No distress.  HENT:  Head: Normocephalic and atraumatic.  Right Ear: Hearing, tympanic membrane and ear canal normal.  Left Ear: Hearing, tympanic membrane and ear canal normal.  Nose: Mucosal edema and rhinorrhea present. Right sinus exhibits no maxillary sinus tenderness and no frontal sinus tenderness. Left sinus exhibits no maxillary sinus tenderness and no frontal sinus tenderness.  Mouth/Throat: Uvula is midline, oropharynx is clear and moist and mucous membranes are normal.  Eyes: Right conjunctiva is injected (mild). Left conjunctiva is not injected (mild). No scleral icterus.  Neck: Normal range of motion. Neck supple. No thyroid mass and no thyromegaly present.  Cardiovascular: Normal rate and regular rhythm.   Pulmonary/Chest: Effort normal and breath sounds normal. No respiratory distress.  Abdominal: Soft. Bowel sounds are normal. She exhibits no distension.  Musculoskeletal: She exhibits no edema or tenderness.  Lymphadenopathy:    She has no cervical adenopathy.  Neurological: She is alert and oriented to person, place, and time. No cranial nerve deficit.  Skin: Skin is warm and dry. No cyanosis. Nails show no clubbing.  Psychiatric: Her behavior is normal. Judgment normal.  Nursing note and vitals reviewed.   ED Course  Procedures N/A  Labs Review Labs Reviewed - No data to display  Imaging Review No results found.  MDM   1. URI (upper respiratory infection)   2. Other seasonal allergic rhinitis    Patient reassured no signs of acute sinusitis. I suspect she has an acute upper respiratory  infection with underlying history of seasonal allergic rhinitis.  Plan 1) Diagnosis reviewed with patient/parent/guardian/caregiver  2) she will continue her allergic eyedrops, nasal steroid, and we will add daily Claritin 10 mg or Zyrtec (she believes she may have some at home) 3) Mucinex twice a day as needed for congestion 4) Recommend supportive treatment with rest, increased fluids 5) Seek additional medical care if symptoms worsen or are not improving, fever or severe pain    Joselyn Arrow, NP 06/22/15 1436

## 2015-06-22 NOTE — ED Notes (Signed)
Onset 4 days sinus headache, facial pressure,  both side ear hurts,  cough and lots of congestion runny nose, greenish yellow mucus ans some nasal bleeding.

## 2015-06-22 NOTE — Discharge Instructions (Signed)
Continue Mucinex twice a day with plenty of fluids Continue Nasacort Add Claritin 10 mg daily Continue your eyedrops Return to clinic or your primary care doctor if fever, pain, new symptoms or worsening symptoms, or your symptoms do not resolve with treatment  Upper Respiratory Infection, Adult Most upper respiratory infections (URIs) are a viral infection of the air passages leading to the lungs. A URI affects the nose, throat, and upper air passages. The most common type of URI is nasopharyngitis and is typically referred to as "the common cold." URIs run their course and usually go away on their own. Most of the time, a URI does not require medical attention, but sometimes a bacterial infection in the upper airways can follow a viral infection. This is called a secondary infection. Sinus and middle ear infections are common types of secondary upper respiratory infections. Bacterial pneumonia can also complicate a URI. A URI can worsen asthma and chronic obstructive pulmonary disease (COPD). Sometimes, these complications can require emergency medical care and may be life threatening.  CAUSES Almost all URIs are caused by viruses. A virus is a type of germ and can spread from one person to another.  RISKS FACTORS You may be at risk for a URI if:   You smoke.   You have chronic heart or lung disease.  You have a weakened defense (immune) system.   You are very young or very old.   You have nasal allergies or asthma.  You work in crowded or poorly ventilated areas.  You work in health care facilities or schools. SIGNS AND SYMPTOMS  Symptoms typically develop 2-3 days after you come in contact with a cold virus. Most viral URIs last 7-10 days. However, viral URIs from the influenza virus (flu virus) can last 14-18 days and are typically more severe. Symptoms may include:   Runny or stuffy (congested) nose.   Sneezing.   Cough.   Sore throat.   Headache.   Fatigue.    Fever.   Loss of appetite.   Pain in your forehead, behind your eyes, and over your cheekbones (sinus pain).  Muscle aches.  DIAGNOSIS  Your health care provider may diagnose a URI by:  Physical exam.  Tests to check that your symptoms are not due to another condition such as:  Strep throat.  Sinusitis.  Pneumonia.  Asthma. TREATMENT  A URI goes away on its own with time. It cannot be cured with medicines, but medicines may be prescribed or recommended to relieve symptoms. Medicines may help:  Reduce your fever.  Reduce your cough.  Relieve nasal congestion. HOME CARE INSTRUCTIONS   Take medicines only as directed by your health care provider.   Gargle warm saltwater or take cough drops to comfort your throat as directed by your health care provider.  Use a warm mist humidifier or inhale steam from a shower to increase air moisture. This may make it easier to breathe.  Drink enough fluid to keep your urine clear or pale yellow.   Eat soups and other clear broths and maintain good nutrition.   Rest as needed.   Return to work when your temperature has returned to normal or as your health care provider advises. You may need to stay home longer to avoid infecting others. You can also use a face mask and careful hand washing to prevent spread of the virus.  Increase the usage of your inhaler if you have asthma.   Do not use any tobacco products, including  cigarettes, chewing tobacco, or electronic cigarettes. If you need help quitting, ask your health care provider. PREVENTION  The best way to protect yourself from getting a cold is to practice good hygiene.   Avoid oral or hand contact with people with cold symptoms.   Wash your hands often if contact occurs.  There is no clear evidence that vitamin C, vitamin E, echinacea, or exercise reduces the chance of developing a cold. However, it is always recommended to get plenty of rest, exercise, and  practice good nutrition.  SEEK MEDICAL CARE IF:   You are getting worse rather than better.   Your symptoms are not controlled by medicine.   You have chills.  You have worsening shortness of breath.  You have brown or red mucus.  You have yellow or brown nasal discharge.  You have pain in your face, especially when you bend forward.  You have a fever.  You have swollen neck glands.  You have pain while swallowing.  You have white areas in the back of your throat. SEEK IMMEDIATE MEDICAL CARE IF:   You have severe or persistent:  Headache.  Ear pain.  Sinus pain.  Chest pain.  You have chronic lung disease and any of the following:  Wheezing.  Prolonged cough.  Coughing up blood.  A change in your usual mucus.  You have a stiff neck.  You have changes in your:  Vision.  Hearing.  Thinking.  Mood. MAKE SURE YOU:   Understand these instructions.  Will watch your condition.  Will get help right away if you are not doing well or get worse.   This information is not intended to replace advice given to you by your health care provider. Make sure you discuss any questions you have with your health care provider.   Document Released: 08/11/2000 Document Revised: 07/02/2014 Document Reviewed: 05/23/2013 Elsevier Interactive Patient Education 2016 [employer].  Allergic Rhinitis Allergic rhinitis is when the mucous membranes in the nose respond to allergens. Allergens are particles in the air that cause your body to have an allergic reaction. This causes you to release allergic antibodies. Through a chain of events, these eventually cause you to release histamine into the blood stream. Although meant to protect the body, it is this release of histamine that causes your discomfort, such as frequent sneezing, congestion, and an itchy, runny nose.  CAUSES Seasonal allergic rhinitis (hay fever) is caused by pollen allergens that may come from grasses,  trees, and weeds. Year-round allergic rhinitis (perennial allergic rhinitis) is caused by allergens such as house dust mites, pet dander, and mold spores. SYMPTOMS  Nasal stuffiness (congestion).  Itchy, runny nose with sneezing and tearing of the eyes. DIAGNOSIS Your health care provider can help you determine the allergen or allergens that trigger your symptoms. If you and your health care provider are unable to determine the allergen, skin or blood testing may be used. Your health care provider will diagnose your condition after taking your health history and performing a physical exam. Your health care provider may assess you for other related conditions, such as asthma, pink eye, or an ear infection. TREATMENT Allergic rhinitis does not have a cure, but it can be controlled by:  Medicines that block allergy symptoms. These may include allergy shots, nasal sprays, and oral antihistamines.  Avoiding the allergen. Hay fever may often be treated with antihistamines in pill or nasal spray forms. Antihistamines block the effects of histamine. There are over-the-counter medicines that may  help with nasal congestion and swelling around the eyes. Check with your health care provider before taking or giving this medicine. If avoiding the allergen or the medicine prescribed do not work, there are many new medicines your health care provider can prescribe. Stronger medicine may be used if initial measures are ineffective. Desensitizing injections can be used if medicine and avoidance does not work. Desensitization is when a patient is given ongoing shots until the body becomes less sensitive to the allergen. Make sure you follow up with your health care provider if problems continue. HOME CARE INSTRUCTIONS It is not possible to completely avoid allergens, but you can reduce your symptoms by taking steps to limit your exposure to them. It helps to know exactly what you are allergic to so that you can avoid  your specific triggers. SEEK MEDICAL CARE IF:  You have a fever.  You develop a cough that does not stop easily (persistent).  You have shortness of breath.  You start wheezing.  Symptoms interfere with normal daily activities.   This information is not intended to replace advice given to you by your health care provider. Make sure you discuss any questions you have with your health care provider.   Document Released: 11/10/2000 Document Revised: 03/08/2014 Document Reviewed: 10/23/2012 Elsevier Interactive Patient Education [employer]

## 2015-06-30 ENCOUNTER — Other Ambulatory Visit: Payer: Self-pay | Admitting: *Deleted

## 2015-06-30 MED ORDER — METOPROLOL SUCCINATE ER 100 MG PO TB24: 100.0000 mg | ORAL_TABLET | Freq: Every day | ORAL | Status: DC

## 2015-07-11 ENCOUNTER — Other Ambulatory Visit: Payer: Self-pay | Admitting: Family Medicine

## 2015-07-18 DIAGNOSIS — D509 Iron deficiency anemia, unspecified: Secondary | ICD-10-CM | POA: Insufficient documentation

## 2015-07-18 DIAGNOSIS — F329 Major depressive disorder, single episode, unspecified: Secondary | ICD-10-CM | POA: Insufficient documentation

## 2015-07-18 DIAGNOSIS — T7840XA Allergy, unspecified, initial encounter: Secondary | ICD-10-CM | POA: Insufficient documentation

## 2015-07-18 DIAGNOSIS — K219 Gastro-esophageal reflux disease without esophagitis: Secondary | ICD-10-CM | POA: Insufficient documentation

## 2015-07-18 DIAGNOSIS — F32A Depression, unspecified: Secondary | ICD-10-CM | POA: Insufficient documentation

## 2015-07-18 DIAGNOSIS — I499 Cardiac arrhythmia, unspecified: Secondary | ICD-10-CM | POA: Insufficient documentation

## 2015-07-18 DIAGNOSIS — M1990 Unspecified osteoarthritis, unspecified site: Secondary | ICD-10-CM | POA: Insufficient documentation

## 2015-07-23 ENCOUNTER — Encounter: Payer: Self-pay | Admitting: Family Medicine

## 2015-07-23 ENCOUNTER — Telehealth: Payer: Self-pay | Admitting: Family Medicine

## 2015-07-23 ENCOUNTER — Ambulatory Visit (INDEPENDENT_AMBULATORY_CARE_PROVIDER_SITE_OTHER): Payer: Medicare PPO | Admitting: Family Medicine

## 2015-07-23 VITALS — BP 130/70 | HR 82 | Temp 97.6°F | Resp 16 | Ht 65.0 in | Wt 174.0 lb

## 2015-07-23 DIAGNOSIS — I10 Essential (primary) hypertension: Secondary | ICD-10-CM

## 2015-07-23 DIAGNOSIS — M810 Age-related osteoporosis without current pathological fracture: Secondary | ICD-10-CM | POA: Diagnosis not present

## 2015-07-23 DIAGNOSIS — Z0000 Encounter for general adult medical examination without abnormal findings: Secondary | ICD-10-CM | POA: Diagnosis not present

## 2015-07-23 DIAGNOSIS — E785 Hyperlipidemia, unspecified: Secondary | ICD-10-CM

## 2015-07-23 DIAGNOSIS — Z1211 Encounter for screening for malignant neoplasm of colon: Secondary | ICD-10-CM

## 2015-07-23 MED ORDER — METOPROLOL SUCCINATE ER 100 MG PO TB24: ORAL_TABLET | ORAL | Status: DC

## 2015-07-23 NOTE — Telephone Encounter (Signed)
Order for cologuard faxed to [facility]

## 2015-07-23 NOTE — Progress Notes (Signed)
Patient: Erica Smith, Female    DOB: 09/04/1940, 75 y.o.   MRN: [id number] Visit Date: 07/23/2015  Today's Provider: Mila Merryonald Trude Cansler, MD   Chief Complaint  Patient presents with  . Annual Exam  . Hypertension  . Insomnia  . Gastroesophageal Reflux  . Hyperlipidemia   Subjective:    Annual physical  Erica ClicheFrances E Crandell is a 75 y.o. female. She feels fairly well. She reports exercising yes. She reports she is sleeping fairly well.  -----------------------------------------------------------   Follow-up for Insomnia from 03/21/2013; no changes.  Follow-up for GERD from 12/14/2013;has no symptoms on lansoprazole    Hypertension, follow-up:  BP Readings from Last 3 Encounters:  07/23/15 130/70  03/06/14 110/76  06/22/15 157/63    She was last seen for hypertension 12/14/2013.  BP at that visit was 144/82. Management since that visit includes; no changes, continue current medications.She reports good compliance with treatment. She is not having side effects. none  She is exercising. She is adherent to low salt diet.   Outside blood pressures are a little high. She is experiencing none.  Patient denies none.   Cardiovascular risk factors include none.  Use of agents associated with hypertension: none.   ----------------------------------------------------------------------    Lipid/Cholesterol, Follow-up:   Last seen for this 12/14/2013.  Management since that visit includes; no changes..  Last Lipid Panel:    Component Value Date/Time   CHOL 185 05/02/2013   TRIG 275* 05/02/2013   HDL 42 05/02/2013   LDLCALC 88 05/02/2013    She reports good compliance with treatment. She is not having side effects. none  Wt Readings from Last 3 Encounters:  07/23/15 174 lb (78.926 kg)  03/06/14 168 lb (76.204 kg)  06/22/15 165 lb (74.844 kg)    ----------------------------------------------------------------------     Review of Systems    Constitutional: Positive for activity change.  HENT: Positive for hearing loss, sinus pressure and tinnitus.   Eyes: Negative.   Respiratory: Negative.   Cardiovascular: Positive for leg swelling.  Gastrointestinal: Positive for constipation and abdominal distention.  Endocrine: Negative.   Genitourinary: Negative.   Musculoskeletal: Positive for myalgias, back pain and neck pain.  Skin: Negative.   Allergic/Immunologic: Positive for environmental allergies.  Neurological: Negative.   Hematological: Bruises/bleeds easily.    Social History   Social History  . Marital Status: Married    Spouse Name: N/A  . Number of Children: N/A  . Years of Education: N/A   Occupational History  . Not on file.   Social History Main Topics  . Smoking status: Never Smoker   . Smokeless tobacco: Not on file  . Alcohol Use: No  . Drug Use: No  . Sexual Activity: Not on file   Other Topics Concern  . Not on file   Social History Narrative    Past Medical History  Diagnosis Date  . GERD (gastroesophageal reflux disease)   . Hypertension   . Pleural effusion 2008    after hip surgery  . Seasonal allergies   . Hypercholesteremia   . Depression   . Osteoporosis   . Bursitis of left hip   . Wears dentures     partial upper  . Swelling of left lower extremity      Patient Active Problem List   Diagnosis Date Noted  . Allergic state 07/18/2015  . Cardiac dysrhythmia 07/18/2015  . Arthritis 07/18/2015  . Clinical depression 07/18/2015  . Acid reflux 07/18/2015  . Anemia, iron  deficiency 07/18/2015  . 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 Osteoporosis 09/11/2014  . Fracture of sacrum (HCC) 06/27/2014  . Closed fracture of single pubic ramus of pelvis (HCC) 06/18/2014  . 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 Peripheral nerve disease (HCC) 01/05/2013  . H/O total hip arthroplasty 11/15/2012  . Secondary pulmonary hypertension (HCC) 09/06/2008  . Irritable  bowel syndrome 03/02/2007  . Chronic constipation 09/08/2005  . Hyperlipidemia 03/02/2003  . Essential (primary) hypertension 06/16/1998    Past Surgical History  Procedure Laterality Date  . Cataract extraction Left   . Tonsillectomy    . Joint replacement Left 2008    hip  . Breast surgery Bilateral     cyst removal  . Total hip revision Left 2008  . Cataract extraction w/phaco Right 12/18/2014    Procedure: CATARACT EXTRACTION PHACO AND INTRAOCULAR LENS PLACEMENT (IOC);  Surgeon: Lockie Mola, MD;  Location: [facility];  Service: Ophthalmology;  Laterality: Right;  . Ovarian cyst removal      Her family history includes Diabetes in her mother; Heart disease in her mother; Hypertension in her father and mother.    Previous Medications   ASPIRIN 81 MG TABLET       CALCIUM CARB-CHOLECALCIFEROL (CALTRATE 600+D) 600-800 MG-UNIT TABS    Take by mouth.   GLUCOSAMINE-CHONDROIT-VIT C-MN (GLUCOSAMINE CHONDR 1500 COMPLX) CAPS    Take by mouth.   HYDROCHLOROTHIAZIDE (HYDRODIURIL) 25 MG TABLET    Take 1 tablet (25 mg total) by mouth daily.   IBANDRONATE (BONIVA) 150 MG TABLET    TAKE 1 TABLET  MONTHLY   LANSOPRAZOLE (PREVACID) 30 MG CAPSULE    TAKE 1 CAPSULE EVERY DAY   LIDODERM 5 %    APPLY 1 PATCH FOR 12 HOURS IN A 24 HOUR PERIOD   LUBIPROSTONE (AMITIZA) 8 MCG CAPSULE    Take by mouth.   METOPROLOL SUCCINATE (TOPROL XL) 100 MG 24 HR TABLET    Take 1 tablet (100 mg total) by mouth daily.   MULTIPLE VITAMIN PO       NORTRIPTYLINE (PAMELOR) 50 MG CAPSULE    TAKE 2 CAPSULES AT BEDTIME   SIMVASTATIN (ZOCOR) 40 MG TABLET    TAKE 1 TABLET EVERY DAY   TRIAMCINOLONE (NASACORT AQ) 55 MCG/ACT AERO NASAL INHALER    Place into the nose.   ZOLPIDEM (AMBIEN CR) 12.5 MG CR TABLET    Take 1 tablet (12.5 mg total) by mouth at bedtime as needed for sleep.    Patient Care Team: Malva Limes, MD as PCP - General (Family Medicine)     Objective:   Vitals: BP 130/70 mmHg  Pulse 82   Temp(Src) 97.6 F (36.4 C) (Oral)  Resp 16  Ht 5\' 5"  (1.651 m)  Wt 174 lb (78.926 kg)  BMI 28.96 kg/m2  SpO2 98%  Physical Exam   General Appearance:    Alert, cooperative, no distress, appears stated age  Head:    Normocephalic, without obvious abnormality, atraumatic  Eyes:    PERRL, conjunctiva/corneas clear, EOM's intact, fundi    benign, both eyes  Ears:    Normal TM's and external ear canals, both ears  Nose:   Nares normal, septum midline, mucosa normal, no drainage    or sinus tenderness  Throat:   Lips, mucosa, and tongue normal; teeth and gums normal  Neck:   Supple, symmetrical, trachea midline, no adenopathy;    thyroid:  no enlargement/tenderness/nodules; no carotid   bruit or JVD  Back:     Symmetric, no curvature, ROM normal, no CVA tenderness  Lungs:     Clear to auscultation bilaterally, respirations unlabored  Chest Wall:    No tenderness or deformity   Heart:    Regular rate and rhythm, S1 and S2 normal, no murmur, rub   or gallop  Breast Exam:    normal appearance, no masses or tenderness  Abdomen:     Soft, non-tender, bowel sounds active all four quadrants,    no masses, no organomegaly  Pelvic:    deferred  Extremities:   Extremities normal, atraumatic, no cyanosis or edema  Pulses:   2+ and symmetric all extremities  Skin:   Skin color, texture, turgor normal, no rashes or lesions  Lymph nodes:   Cervical, supraclavicular, and axillary nodes normal  Neurologic:   CNII-XII intact, normal strength, sensation and reflexes    throughout    Activities of Daily Living In your present state of health, do you have any difficulty performing the following activities: 07/23/2015 12/18/2014  Hearing? Y N  Vision? N Y  Difficulty concentrating or making decisions? N N  Walking or climbing stairs? Y N  Dressing or bathing? N N  Doing errands, shopping? N -    Fall Risk Assessment Fall Risk  07/23/2015  Falls in the past year? Yes  Number falls in past yr: 1   Injury with Fall? Yes     Depression Screen PHQ 2/9 Scores 07/23/2015  PHQ - 2 Score 0  PHQ- 9 Score 2    Cognitive Testing - 6-CIT  Correct? Score   What year is it? yes 0 0 or 4  What month is it? yes 0 0 or 3  Memorize:    Floyde Parkins,  40,  [address],  [place],      What time is it? (within 1 hour) yes 0 0 or 3  Count backwards from 20 yes 0 0, 2, or 4  Name the months of the year yes 0 0, 2, or 4  Repeat name & address above yes 2 0, 2, 4, 6, 8, or 10       TOTAL SCORE  2/28   Interpretation:  Normal  Normal (0-7) Abnormal (8-28)    Audit-C Alcohol Use Screening  Question Answer Points  How often do you have alcoholic drink? never 0  On days you do drink alcohol, how many drinks do you typically consume? 0 0  How oftey will you drink 6 or more in a total? never 0  Total Score:  0   A score of 3 or more in women, and 4 or more in men indicates increased risk for alcohol abuse, EXCEPT if all of the points are from question 1.     Assessment & Plan:    Annual physical Reviewed patient's Family Medical History Reviewed and updated list of patient's medical providers Assessment of cognitive impairment was done Assessed patient's functional ability Established a written schedule for health screening services Health Risk Assessent Completed and Reviewed  Exercise Activities and Dietary recommendations Goals    None      Immunization History  Administered Date(s) Administered  . Influenza, High Dose Seasonal PF 11/21/2014  . Influenza-Unspecified 10/30/2013  . Pneumococcal Conjugate-13 12/14/2013  . Pneumococcal Polysaccharide-23 12/28/2003  . Td 08/30/2000    Health Maintenance  Topic Date Due  . ZOSTAVAX  09/16/1998  . DEXA SCAN  09/16/2003  . TETANUS/TDAP  08/31/2010  . INFLUENZA VACCINE  09/30/2015  . PNA vac Low Risk Adult  Completed      Discussed health benefits of physical activity, and encouraged her to engage in regular exercise  appropriate for her age and condition.    --------------------------------------------------------------------------------  1. Annual physical exam Generally doing well  2. Hyperlipidemia Well controlled.  Continue current medications.   - Lipid panel - Comprehensive metabolic panel - TSH  3. Osteoporosis Been on Boniva several years. Consider bisphosphonate holiday after reviewing BMD - DG Bone Density; Future  4. Essential (primary) hypertension Well controlled.  Continue current medications.   - Comprehensive metabolic panel - EKG 12-Lead - TSH  5. Colon cancer screening  - Cologuard; Future

## 2015-07-24 DIAGNOSIS — E785 Hyperlipidemia, unspecified: Secondary | ICD-10-CM | POA: Diagnosis not present

## 2015-07-24 DIAGNOSIS — I10 Essential (primary) hypertension: Secondary | ICD-10-CM | POA: Diagnosis not present

## 2015-07-25 LAB — LIPID PANEL
CHOL/HDL RATIO: 4 ratio (ref 0.0–4.4)
CHOLESTEROL TOTAL: 180 mg/dL (ref 100–199)
HDL: 45 mg/dL (ref >39)
LDL CALC: 78 mg/dL (ref 0–99)
Triglycerides: 287 mg/dL — ABNORMAL HIGH (ref 0–149)
VLDL CHOLESTEROL CAL: 57 mg/dL — AB (ref 5–40)

## 2015-07-25 LAB — COMPREHENSIVE METABOLIC PANEL
ALK PHOS: 77 IU/L (ref 39–117)
ALT: 32 IU/L (ref 0–32)
AST: 27 IU/L (ref 0–40)
Albumin/Globulin Ratio: 1.4 (ref 1.2–2.2)
Albumin: 4.3 g/dL (ref 3.5–4.8)
BUN/Creatinine Ratio: 19 (ref 12–28)
BUN: 13 mg/dL (ref 8–27)
Bilirubin Total: 0.3 mg/dL (ref 0.0–1.2)
CALCIUM: 10.8 mg/dL — AB (ref 8.7–10.3)
CO2: 25 mmol/L (ref 18–29)
CREATININE: 0.67 mg/dL (ref 0.57–1.00)
Chloride: 98 mmol/L (ref 96–106)
GFR calc Af Amer: 99 mL/min/{1.73_m2} (ref >59)
GFR, EST NON AFRICAN AMERICAN: 86 mL/min/{1.73_m2} (ref >59)
GLUCOSE: 82 mg/dL (ref 65–99)
Globulin, Total: 3.1 g/dL (ref 1.5–4.5)
Potassium: 4 mmol/L (ref 3.5–5.2)
Sodium: 140 mmol/L (ref 134–144)
Total Protein: 7.4 g/dL (ref 6.0–8.5)

## 2015-07-25 LAB — TSH: TSH: 2.25 u[IU]/mL (ref 0.450–4.500)

## 2015-07-29 DIAGNOSIS — Z1211 Encounter for screening for malignant neoplasm of colon: Secondary | ICD-10-CM | POA: Diagnosis not present

## 2015-07-29 DIAGNOSIS — Z1212 Encounter for screening for malignant neoplasm of rectum: Secondary | ICD-10-CM | POA: Diagnosis not present

## 2015-07-29 LAB — COLOGUARD: Cologuard: NEGATIVE

## 2015-07-30 ENCOUNTER — Inpatient Hospital Stay: Admission: RE | Admit: 2015-07-30 | Payer: Medicare PPO | Source: Ambulatory Visit

## 2015-08-04 ENCOUNTER — Ambulatory Visit
Admission: RE | Admit: 2015-08-04 | Discharge: 2015-08-04 | Disposition: A | Discharge status: Home / Self Care | Payer: Medicare PPO | Source: Ambulatory Visit | Attending: Family Medicine | Admitting: Family Medicine

## 2015-08-04 DIAGNOSIS — M85851 Other specified disorders of bone density and structure, right thigh: Secondary | ICD-10-CM | POA: Insufficient documentation

## 2015-08-04 DIAGNOSIS — M810 Age-related osteoporosis without current pathological fracture: Secondary | ICD-10-CM | POA: Insufficient documentation

## 2015-08-11 ENCOUNTER — Other Ambulatory Visit: Payer: Self-pay | Admitting: Family Medicine

## 2015-08-11 MED ORDER — ZOLPIDEM TARTRATE ER 12.5 MG PO TBCR: 12.5000 mg | EXTENDED_RELEASE_TABLET | Freq: Every evening | ORAL | Status: DC | PRN

## 2015-08-11 NOTE — Telephone Encounter (Signed)
Rx called in to pharmacy. 

## 2015-08-11 NOTE — Telephone Encounter (Signed)
Please call in zolpidem  

## 2015-08-11 NOTE — Telephone Encounter (Signed)
Pt would like to have refill on zolpidem (AMBIEN CR) 12.5 MG CR tablet sent to [facility] in [place]

## 2015-08-13 ENCOUNTER — Other Ambulatory Visit: Payer: Self-pay | Admitting: Family Medicine

## 2015-08-13 DIAGNOSIS — Z1231 Encounter for screening mammogram for malignant neoplasm of breast: Secondary | ICD-10-CM

## 2015-08-14 ENCOUNTER — Other Ambulatory Visit: Payer: Self-pay | Admitting: *Deleted

## 2015-08-14 DIAGNOSIS — Z1211 Encounter for screening for malignant neoplasm of colon: Secondary | ICD-10-CM

## 2015-08-21 ENCOUNTER — Other Ambulatory Visit: Payer: Self-pay | Admitting: Family Medicine

## 2015-08-27 ENCOUNTER — Ambulatory Visit
Admission: RE | Admit: 2015-08-27 | Discharge: 2015-08-27 | Disposition: A | Discharge status: Home / Self Care | Payer: Medicare PPO | Source: Ambulatory Visit | Attending: Family Medicine | Admitting: Family Medicine

## 2015-08-27 DIAGNOSIS — Z1231 Encounter for screening mammogram for malignant neoplasm of breast: Secondary | ICD-10-CM | POA: Diagnosis not present

## 2015-09-01 ENCOUNTER — Other Ambulatory Visit: Payer: Self-pay | Admitting: *Deleted

## 2015-09-01 MED ORDER — LUBIPROSTONE 8 MCG PO CAPS: 8.0000 ug | ORAL_CAPSULE | Freq: Every day | ORAL | Status: DC

## 2015-09-09 ENCOUNTER — Ambulatory Visit (INDEPENDENT_AMBULATORY_CARE_PROVIDER_SITE_OTHER): Payer: Medicare PPO | Admitting: Family Medicine

## 2015-09-09 VITALS — BP 134/76 | HR 96 | Temp 98.4°F | Resp 16 | Wt 174.0 lb

## 2015-09-09 DIAGNOSIS — J40 Bronchitis, not specified as acute or chronic: Secondary | ICD-10-CM

## 2015-09-09 MED ORDER — AZITHROMYCIN 250 MG PO TABS: ORAL_TABLET | ORAL | Status: AC

## 2015-09-09 NOTE — Progress Notes (Signed)
Patient: Erica Smith Female    DOB: 29-Oct-1940   75 y.o.   MRN: [id number] Visit Date: 09/09/2015  Today's Provider: Mila Merry, MD   Chief Complaint  Patient presents with  . URI   Subjective:    URI  This is a new problem. The current episode started in the past 7 days (Pt says her symptoms started Sunday (09/07/2015)). The problem has been unchanged. There has been no fever. Associated symptoms include congestion, coughing, sneezing and wheezing (especially at night. ). Pertinent negatives include no abdominal pain, chest pain, ear pain, headaches, nausea, plugged ear sensation, rhinorrhea, sinus pain, sore throat or vomiting. She has tried NSAIDs (Mucinex D; Nasacort) for the symptoms. The treatment provided moderate relief.       Allergies  Allergen Reactions  . Alendronate Sodium     Pt denies   . Fish Oil     upset stomach, pt denies   . Fluticasone Propionate     Other reaction(s): Dizzyness, pt denies   . Lovastatin     Pt denies   . Bextra [Valdecoxib] Rash  . Celebrex [Celecoxib] Rash  . Ciprofloxacin Rash  . Ceftin [Cefuroxime Axetil] Rash  . Phazyme [Simethicone] Rash  . Sulfamethoxazole-Trimethoprim Rash        Current Meds  Medication Sig  . aspirin 81 MG tablet   . Calcium Carb-Cholecalciferol (CALTRATE 600+D) 600-800 MG-UNIT TABS Take by mouth.  . Glucosamine-Chondroit-Vit C-Mn (GLUCOSAMINE CHONDR 1500 COMPLX) CAPS Take by mouth.  . hydrochlorothiazide (HYDRODIURIL) 25 MG tablet TAKE 1 TABLET EVERY DAY  . lansoprazole (PREVACID) 30 MG capsule TAKE 1 CAPSULE EVERY DAY  . LIDODERM 5 % APPLY 1 PATCH FOR 12 HOURS IN A 24 HOUR PERIOD  . lubiprostone (AMITIZA) 8 MCG capsule Take 1 capsule (8 mcg total) by mouth daily.  . metoprolol succinate (TOPROL XL) 100 MG 24 hr tablet Take 1 1/2 tablets daily  . MULTIPLE VITAMIN PO   . nortriptyline (PAMELOR) 50 MG capsule TAKE 2 CAPSULES AT BEDTIME  . simvastatin (ZOCOR) 40 MG tablet TAKE 1 TABLET  EVERY DAY  . triamcinolone (NASACORT AQ) 55 MCG/ACT AERO nasal inhaler Place into the nose.  . zolpidem (AMBIEN CR) 12.5 MG CR tablet Take 1 tablet (12.5 mg total) by mouth at bedtime as needed for sleep.    Review of Systems  Constitutional: Positive for fatigue. Negative for fever, chills, diaphoresis, activity change, appetite change and unexpected weight change.  HENT: Positive for congestion, postnasal drip, sneezing and tinnitus (Chronic issue). Negative for ear discharge, ear pain, nosebleeds, rhinorrhea, sinus pressure, sore throat and trouble swallowing.   Respiratory: Positive for cough, chest tightness and wheezing (especially at night. ). Negative for apnea and shortness of breath.   Cardiovascular: Negative for chest pain and leg swelling.  Gastrointestinal: Negative.  Negative for nausea, vomiting and abdominal pain.  Allergic/Immunologic: Positive for environmental allergies.  Neurological: Negative for dizziness, light-headedness and headaches.    Social History  Substance Use Topics  . Smoking status: Never Smoker   . Smokeless tobacco: Not on file  . Alcohol Use: No   Objective:   BP 134/76 mmHg  Pulse 96  Temp(Src) 98.4 F (36.9 C) (Oral)  Resp 16  Wt 174 lb (78.926 kg)  SpO2 95%  Physical Exam  General Appearance:    Alert, cooperative, no distress  HENT:   both sides TM normal without fluid or infection, neck without nodes, throat normal without erythema or  exudate, frontal sinus tender and nasal mucosa pale and congested  Eyes:    PERRL, conjunctiva/corneas clear, EOM's intact       Lungs:     Occasional expiratory wheeze, no rales, , respirations unlabored  Heart:    Regular rate and rhythm  Neurologic:   Awake, alert, oriented x 3. No apparent focal neurological           defect.           Assessment & Plan:     1. Bronchitis Call if symptoms change or if not rapidly improving.     - azithromycin (ZITHROMAX) 250 MG tablet; 2 by mouth today,  then 1 daily for 4 days  Dispense: 6 tablet; Refill: 0     The entirety of the information documented in the History of Present Illness, Review of Systems and Physical Exam were personally obtained by me. Portions of this information were initially documented by Kavin LeechLaura Walsh, CMA and reviewed by me for thoroughness and accuracy.       Mila Merryonald Fisher, MD  [facility]

## 2015-09-09 NOTE — Patient Instructions (Signed)
Fall Prevention in the Home  Falls can cause injuries and can affect people from all age groups. There are many simple things that you can do to make your home safe and to help prevent falls. WHAT CAN I DO ON THE OUTSIDE OF MY HOME?  Regularly repair the edges of walkways and driveways and fix any cracks.  Remove high doorway thresholds.  Trim any shrubbery on the main path into your home.  Use bright outdoor lighting.  Clear walkways of debris and clutter, including tools and rocks.  Regularly check that handrails are securely fastened and in good repair. Both sides of any steps should have handrails.  Install guardrails along the edges of any raised decks or porches.  Have leaves, snow, and ice cleared regularly.  Use sand or salt on walkways during winter months.  In the garage, clean up any spills right away, including grease or oil spills. WHAT CAN I DO IN THE BATHROOM?  Use night lights.  Install grab bars by the toilet and in the tub and shower. Do not use towel bars as grab bars.  Use non-skid mats or decals on the floor of the tub or shower.  If you need to sit down while you are in the shower, use a plastic, non-slip stool..  Keep the floor dry. Immediately clean up any water that spills on the floor.  Remove soap buildup in the tub or shower on a regular basis.  Attach bath mats securely with double-sided non-slip rug tape.  Remove throw rugs and other tripping hazards from the floor. WHAT CAN I DO IN THE BEDROOM?  Use night lights.  Make sure that a bedside light is easy to reach.  Do not use oversized bedding that drapes onto the floor.  Have a firm chair that has side arms to use for getting dressed.  Remove throw rugs and other tripping hazards from the floor. WHAT CAN I DO IN THE KITCHEN?   Clean up any spills right away.  Avoid walking on wet floors.  Place frequently used items in easy-to-reach places.  If you need to reach for something  above you, use a sturdy step stool that has a grab bar.  Keep electrical cables out of the way.  Do not use floor polish or wax that makes floors slippery. If you have to use wax, make sure that it is non-skid floor wax.  Remove throw rugs and other tripping hazards from the floor. WHAT CAN I DO IN THE STAIRWAYS?  Do not leave any items on the stairs.  Make sure that there are handrails on both sides of the stairs. Fix handrails that are broken or loose. Make sure that handrails are as long as the stairways.  Check any carpeting to make sure that it is firmly attached to the stairs. Fix any carpet that is loose or worn.  Avoid having throw rugs at the top or bottom of stairways, or secure the rugs with carpet tape to prevent them from moving.  Make sure that you have a light switch at the top of the stairs and the bottom of the stairs. If you do not have them, have them installed. WHAT ARE SOME OTHER FALL PREVENTION TIPS?  Wear closed-toe shoes that fit well and support your feet. Wear shoes that have rubber soles or low heels.  When you use a stepladder, make sure that it is completely opened and that the sides are firmly locked. Have someone hold the ladder while you   are using it. Do not climb a closed stepladder.  Add color or contrast paint or tape to grab bars and handrails in your home. Place contrasting color strips on the first and last steps.  Use mobility aids as needed, such as canes, walkers, scooters, and crutches.  Turn on lights if it is dark. Replace any light bulbs that burn out.  Set up furniture so that there are clear paths. Keep the furniture in the same spot.  Fix any uneven floor surfaces.  Choose a carpet design that does not hide the edge of steps of a stairway.  Be aware of any and all pets.  Review your medicines with your healthcare provider. Some medicines can cause dizziness or changes in blood pressure, which increase your risk of falling. Talk  with your health care provider about other ways that you can decrease your risk of falls. This may include working with a physical therapist or trainer to improve your strength, balance, and endurance.   This information is not intended to replace advice given to you by your health care provider. Make sure you discuss any questions you have with your health care provider.   Document Released: 02/05/2002 Document Revised: 07/02/2014 Document Reviewed: 03/22/2014 Elsevier Interactive Patient Education 2016 Elsevier Inc.  

## 2015-11-06 ENCOUNTER — Other Ambulatory Visit: Payer: Self-pay | Admitting: Family Medicine

## 2015-11-06 MED ORDER — NORTRIPTYLINE HCL 50 MG PO CAPS: 100.0000 mg | ORAL_CAPSULE | Freq: Every day | ORAL | 3 refills | Status: DC

## 2015-11-06 NOTE — Telephone Encounter (Signed)
Pt needs refill on her nortriptyline 50mg  2 at night  [facility] [employer]  Pt Call back is [phone]  [facility] [facility]

## 2015-11-21 ENCOUNTER — Ambulatory Visit (INDEPENDENT_AMBULATORY_CARE_PROVIDER_SITE_OTHER): Payer: Medicare PPO

## 2015-11-21 DIAGNOSIS — Z23 Encounter for immunization: Secondary | ICD-10-CM | POA: Diagnosis not present

## 2015-12-04 ENCOUNTER — Encounter: Payer: Self-pay | Admitting: *Deleted

## 2015-12-04 ENCOUNTER — Emergency Department
Admission: EM | Admit: 2015-12-04 | Discharge: 2015-12-04 | Disposition: A | Discharge status: Home / Self Care | Payer: Medicare PPO | Attending: Emergency Medicine | Admitting: Emergency Medicine

## 2015-12-04 ENCOUNTER — Emergency Department: Payer: Medicare PPO

## 2015-12-04 DIAGNOSIS — R0789 Other chest pain: Secondary | ICD-10-CM | POA: Diagnosis not present

## 2015-12-04 DIAGNOSIS — Y999 Unspecified external cause status: Secondary | ICD-10-CM | POA: Diagnosis not present

## 2015-12-04 DIAGNOSIS — Y92410 Unspecified street and highway as the place of occurrence of the external cause: Secondary | ICD-10-CM | POA: Insufficient documentation

## 2015-12-04 DIAGNOSIS — I10 Essential (primary) hypertension: Secondary | ICD-10-CM | POA: Insufficient documentation

## 2015-12-04 DIAGNOSIS — Z7982 Long term (current) use of aspirin: Secondary | ICD-10-CM | POA: Insufficient documentation

## 2015-12-04 DIAGNOSIS — Z79899 Other long term (current) drug therapy: Secondary | ICD-10-CM | POA: Insufficient documentation

## 2015-12-04 DIAGNOSIS — R079 Chest pain, unspecified: Secondary | ICD-10-CM | POA: Diagnosis present

## 2015-12-04 DIAGNOSIS — Y9389 Activity, other specified: Secondary | ICD-10-CM | POA: Diagnosis not present

## 2015-12-04 DIAGNOSIS — S299XXA Unspecified injury of thorax, initial encounter: Secondary | ICD-10-CM | POA: Diagnosis not present

## 2015-12-04 LAB — BASIC METABOLIC PANEL
Anion gap: 6 (ref 5–15)
BUN: 15 mg/dL (ref 6–20)
CALCIUM: 10.3 mg/dL (ref 8.9–10.3)
CO2: 30 mmol/L (ref 22–32)
CREATININE: 0.72 mg/dL (ref 0.44–1.00)
Chloride: 99 mmol/L — ABNORMAL LOW (ref 101–111)
GFR calc Af Amer: >60 mL/min (ref >60)
GLUCOSE: 141 mg/dL — AB (ref 65–99)
Potassium: 4 mmol/L (ref 3.5–5.1)
Sodium: 135 mmol/L (ref 135–145)

## 2015-12-04 LAB — CBC
HCT: 39.7 % (ref 35.0–47.0)
Hemoglobin: 13.3 g/dL (ref 12.0–16.0)
MCH: 28.4 pg (ref 26.0–34.0)
MCHC: 33.6 g/dL (ref 32.0–36.0)
MCV: 84.4 fL (ref 80.0–100.0)
Platelets: 160 10*3/uL (ref 150–440)
RBC: 4.7 MIL/uL (ref 3.80–5.20)
RDW: 14.3 % (ref 11.5–14.5)
WBC: 7.3 10*3/uL (ref 3.6–11.0)

## 2015-12-04 LAB — TROPONIN I: Troponin I: <0.03 ng/mL (ref <0.03)

## 2015-12-04 MED ORDER — OXYCODONE-ACETAMINOPHEN 5-325 MG PO TABS: 1.0000 | ORAL_TABLET | Freq: Four times a day (QID) | ORAL | 0 refills | Status: DC | PRN

## 2015-12-04 MED ORDER — OXYCODONE-ACETAMINOPHEN 5-325 MG PO TABS
1.0000 | ORAL_TABLET | Freq: Once | ORAL | Status: AC
  Administered 2015-12-04: 1 via ORAL
  Filled 2015-12-04: qty 1
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
## 2016-01-13 DIAGNOSIS — L570 Actinic keratosis: Secondary | ICD-10-CM | POA: Diagnosis not present

## 2016-01-13 DIAGNOSIS — Z872 Personal history of diseases of the skin and subcutaneous tissue: Secondary | ICD-10-CM | POA: Diagnosis not present

## 2016-01-13 DIAGNOSIS — Z09 Encounter for follow-up examination after completed treatment for conditions other than malignant neoplasm: Secondary | ICD-10-CM | POA: Diagnosis not present

## 2016-01-13 DIAGNOSIS — L820 Inflamed seborrheic keratosis: Secondary | ICD-10-CM | POA: Diagnosis not present

## 2016-01-13 DIAGNOSIS — Z1283 Encounter for screening for malignant neoplasm of skin: Secondary | ICD-10-CM | POA: Diagnosis not present

## 2016-01-30 ENCOUNTER — Other Ambulatory Visit: Payer: Self-pay | Admitting: *Deleted

## 2016-01-30 MED ORDER — ZOLPIDEM TARTRATE ER 12.5 MG PO TBCR: 12.5000 mg | EXTENDED_RELEASE_TABLET | Freq: Every evening | ORAL | 5 refills | Status: DC | PRN

## 2016-01-30 NOTE — Telephone Encounter (Signed)
Please call in zolpidem  

## 2016-01-30 NOTE — Telephone Encounter (Signed)
rx called in-aa 

## 2016-03-17 ENCOUNTER — Other Ambulatory Visit: Payer: Self-pay | Admitting: Family Medicine

## 2016-04-15 ENCOUNTER — Other Ambulatory Visit: Payer: Self-pay | Admitting: Family Medicine

## 2016-04-19 ENCOUNTER — Ambulatory Visit (INDEPENDENT_AMBULATORY_CARE_PROVIDER_SITE_OTHER): Payer: Medicare PPO | Admitting: Family Medicine

## 2016-04-19 ENCOUNTER — Encounter: Payer: Self-pay | Admitting: Family Medicine

## 2016-04-19 VITALS — BP 150/100 | HR 83 | Temp 98.0°F | Resp 16 | Wt 169.0 lb

## 2016-04-19 DIAGNOSIS — J069 Acute upper respiratory infection, unspecified: Secondary | ICD-10-CM | POA: Diagnosis not present

## 2016-04-19 MED ORDER — AZITHROMYCIN 250 MG PO TABS: ORAL_TABLET | ORAL | 0 refills | Status: AC

## 2016-04-19 NOTE — Patient Instructions (Signed)
Upper Respiratory Infection, Adult Most upper respiratory infections (URIs) are caused by a virus. A URI affects the nose, throat, and upper air passages. The most common type of URI is often called "the common cold." Follow these instructions at home:  Take medicines only as told by your doctor.  Gargle warm saltwater or take cough drops to comfort your throat as told by your doctor.  Use a warm mist humidifier or inhale steam from a shower to increase air moisture. This may make it easier to breathe.  Drink enough fluid to keep your pee (urine) clear or pale yellow.  Eat soups and other clear broths.  Have a healthy diet.  Rest as needed.  Go back to work when your fever is gone or your doctor says it is okay.  You may need to stay home longer to avoid giving your URI to others.  You can also wear a face mask and wash your hands often to prevent spread of the virus.  Use your inhaler more if you have asthma.  Do not use any tobacco products, including cigarettes, chewing tobacco, or electronic cigarettes. If you need help quitting, ask your doctor. Contact a doctor if:  You are getting worse, not better.  Your symptoms are not helped by medicine.  You have chills.  You are getting more short of breath.  You have brown or red mucus.  You have yellow or brown discharge from your nose.  You have pain in your face, especially when you bend forward.  You have a fever.  You have puffy (swollen) neck glands.  You have pain while swallowing.  You have white areas in the back of your throat. Get help right away if:  You have very bad or constant:  Headache.  Ear pain.  Pain in your forehead, behind your eyes, and over your cheekbones (sinus pain).  Chest pain.  You have long-lasting (chronic) lung disease and any of the following:  Wheezing.  Long-lasting cough.  Coughing up blood.  A change in your usual mucus.  You have a stiff neck.  You have  changes in your:  Vision.  Hearing.  Thinking.  Mood. This information is not intended to replace advice given to you by your health care provider. Make sure you discuss any questions you have with your health care provider. Document Released: 08/04/2007 Document Revised: 10/19/2015 Document Reviewed: 05/23/2013 Elsevier Interactive Patient Education  2017 Elsevier Inc.  

## 2016-04-19 NOTE — Progress Notes (Signed)
Patient: Erica Smith Female    DOB: [birth year]   76 y.o.   MRN: [id number] Visit Date: 04/19/2016  Today's Provider: Mila Merryonald Riddik Senna, MD   Chief Complaint  Patient presents with  . Sore Throat   Subjective:    Patient has had watery eyes and slight ear pain for about two weeks. Patient was had a starchy throat for 3 days. Patient stated that she has some gum pain and neck pain also. Patient has been taking otc mucinex and Nasacort with mild relief. No fever and only a mild cough.     Sore Throat   This is a new problem. The current episode started in the past 7 days (3 days). The problem has been unchanged. There has been no fever. Associated symptoms include coughing, ear pain, headaches, a hoarse voice, neck pain, shortness of breath, swollen glands and trouble swallowing. Pertinent negatives include no abdominal pain, congestion, diarrhea, drooling, ear discharge, plugged ear sensation, stridor or vomiting. She has had no exposure to strep or mono. Treatments tried: mucinex and nasacort. The treatment provided mild relief.       Allergies  Allergen Reactions  . Alendronate Sodium     Pt denies   . Fish Oil     upset stomach, pt denies   . Fluticasone Propionate     Other reaction(s): Dizzyness, pt denies   . Lovastatin     Pt denies   . Bextra [Valdecoxib] Rash  . Ceftin [Cefuroxime Axetil] Rash  . Celebrex [Celecoxib] Rash  . Ciprofloxacin Rash  . Phazyme [Simethicone] Rash  . Sulfamethoxazole-Trimethoprim Rash          Current Outpatient Prescriptions:  .  aspirin 81 MG tablet, , Disp: , Rfl:  .  Calcium Carb-Cholecalciferol (CALTRATE 600+D) 600-800 MG-UNIT TABS, Take by mouth., Disp: , Rfl:  .  Glucosamine-Chondroit-Vit C-Mn (GLUCOSAMINE CHONDR 1500 COMPLX) CAPS, Take by mouth., Disp: , Rfl:  .  hydrochlorothiazide (HYDRODIURIL) 25 MG tablet, TAKE 1 TABLET EVERY DAY, Disp: 90 tablet, Rfl: 3 .  lansoprazole (PREVACID) 30 MG capsule, TAKE 1 CAPSULE  EVERY DAY, Disp: 90 capsule, Rfl: 3 .  LIDODERM 5 %, APPLY 1 PATCH FOR 12 HOURS IN A 24 HOUR PERIOD, Disp: , Rfl: 3 .  lubiprostone (AMITIZA) 8 MCG capsule, Take 1 capsule (8 mcg total) by mouth daily., Disp: 90 capsule, Rfl: 3 .  metoprolol succinate (TOPROL XL) 100 MG 24 hr tablet, Take 1 1/2 tablets daily, Disp: 135 tablet, Rfl: 4 .  MULTIPLE VITAMIN PO, , Disp: , Rfl:  .  nortriptyline (PAMELOR) 50 MG capsule, Take 2 capsules (100 mg total) by mouth at bedtime., Disp: 180 capsule, Rfl: 3 .  oxyCODONE-acetaminophen (ROXICET) 5-325 MG tablet, Take 1 tablet by mouth every 6 (six) hours as needed., Disp: 12 tablet, Rfl: 0 .  simvastatin (ZOCOR) 40 MG tablet, TAKE 1 TABLET EVERY DAY, Disp: 90 tablet, Rfl: 1 .  triamcinolone (NASACORT AQ) 55 MCG/ACT AERO nasal inhaler, Place into the nose., Disp: , Rfl:  .  zolpidem (AMBIEN CR) 12.5 MG CR tablet, Take 1 tablet (12.5 mg total) by mouth at bedtime as needed for sleep., Disp: 30 tablet, Rfl: 5  Review of Systems  HENT: Positive for ear pain, hoarse voice, sore throat and trouble swallowing. Negative for congestion, drooling and ear discharge.   Eyes: Positive for discharge, redness and itching.  Respiratory: Positive for cough and shortness of breath. Negative for stridor.   Gastrointestinal: Negative for  abdominal pain, diarrhea and vomiting.  Musculoskeletal: Positive for neck pain.  Neurological: Positive for headaches.    Social History  Substance Use Topics  . Smoking status: Never Smoker  . Smokeless tobacco: Never Used  . Alcohol use No   Objective:   BP (!) 150/100 (BP Location: Right Arm, Patient Position: Sitting, Cuff Size: Normal)   Pulse 83   Temp 98 F (36.7 C) (Oral)   Resp 16   Wt 169 lb (76.7 kg)   SpO2 99%   BMI 29.01 kg/m   Physical Exam  General Appearance:    Alert, cooperative, no distress  HENT:   bilateral TM normal without fluid or infection, neck without nodes, pharynx erythematous without exudate, sinuses  nontender, post nasal drip noted and nasal mucosa pale and congested  Eyes:    PERRL, conjunctiva/corneas clear, EOM's intact       Lungs:     Clear to auscultation bilaterally, respirations unlabored  Heart:    Regular rate and rhythm  Neurologic:   Awake, alert, oriented x 3. No apparent focal neurological           defect.           Assessment & Plan:     1. Upper respiratory tract infection, unspecified type  - azithromycin (ZITHROMAX) 250 MG tablet; 2 by mouth today, then 1 daily for 4 days  Dispense: 6 tablet; Refill: 0  Call if symptoms change or if not rapidly improving.          Mila Merry, MD  [facility]

## 2016-04-20 ENCOUNTER — Ambulatory Visit: Payer: Self-pay | Admitting: Family Medicine

## 2016-06-25 ENCOUNTER — Other Ambulatory Visit: Payer: Self-pay | Admitting: Family Medicine

## 2016-07-01 ENCOUNTER — Other Ambulatory Visit: Payer: Self-pay | Admitting: Family Medicine

## 2016-07-21 ENCOUNTER — Ambulatory Visit (INDEPENDENT_AMBULATORY_CARE_PROVIDER_SITE_OTHER): Payer: Medicare PPO

## 2016-07-21 ENCOUNTER — Ambulatory Visit (INDEPENDENT_AMBULATORY_CARE_PROVIDER_SITE_OTHER): Payer: Medicare PPO | Admitting: Family Medicine

## 2016-07-21 VITALS — BP 164/88 | HR 88 | Temp 97.8°F | Ht 64.0 in | Wt 168.2 lb

## 2016-07-21 VITALS — BP 144/74

## 2016-07-21 DIAGNOSIS — Z0000 Encounter for general adult medical examination without abnormal findings: Secondary | ICD-10-CM | POA: Diagnosis not present

## 2016-07-21 DIAGNOSIS — I499 Cardiac arrhythmia, unspecified: Secondary | ICD-10-CM

## 2016-07-21 DIAGNOSIS — M810 Age-related osteoporosis without current pathological fracture: Secondary | ICD-10-CM

## 2016-07-21 DIAGNOSIS — G4700 Insomnia, unspecified: Secondary | ICD-10-CM

## 2016-07-21 DIAGNOSIS — K5909 Other constipation: Secondary | ICD-10-CM | POA: Diagnosis not present

## 2016-07-21 DIAGNOSIS — I10 Essential (primary) hypertension: Secondary | ICD-10-CM | POA: Diagnosis not present

## 2016-07-21 DIAGNOSIS — E785 Hyperlipidemia, unspecified: Secondary | ICD-10-CM

## 2016-07-21 NOTE — Patient Instructions (Addendum)
[name] , Thank you for taking time to come for your Medicare Wellness Visit. I appreciate your ongoing commitment to your health goals. Please review the following plan we discussed and let me know if I can assist you in the future.   Screening recommendations/referrals: Colonoscopy: completed 10/04/2005, stool cards completed in 2017, due again in 2019 Mammogram: completed 08/28/15, due 07/2016 Bone Density: completed 08/04/15 Recommended yearly ophthalmology/optometry visit for glaucoma screening and checkup Recommended yearly dental visit for hygiene and checkup  Vaccinations: Influenza vaccine: up to date, due 10/2016 Pneumococcal vaccine: completed series Tdap vaccine: declined Shingles vaccine: declined   Advanced directives: Please bring a copy of your POA (Power of Attorney) and/or Living Will to your next appointment.   Conditions/risks identified: Recommend increasing water intake to 3-4 glasses a day.  Next appointment: None, need to schedule 1 year AWV.   Preventive Care 7265 Years and Older, Female Preventive care refers to lifestyle choices and visits with your health care provider that can promote health and wellness. What does preventive care include?  A yearly physical exam. This is also called an annual well check.  Dental exams once or twice a year.  Routine eye exams. Ask your health care provider how often you should have your eyes checked.  Personal lifestyle choices, including:  Daily care of your teeth and gums.  Regular physical activity.  Eating a healthy diet.  Avoiding tobacco and drug use.  Limiting alcohol use.  Practicing safe sex.  Taking low-dose aspirin every day.  Taking vitamin and mineral supplements as recommended by your health care provider. What happens during an annual well check? The services and screenings done by your health care provider during your annual well check will depend on your age, overall health, lifestyle  risk factors, and family history of disease. Counseling  Your health care provider may ask you questions about your:  Alcohol use.  Tobacco use.  Drug use.  Emotional well-being.  Home and relationship well-being.  Sexual activity.  Eating habits.  History of falls.  Memory and ability to understand (cognition).  Work and work Astronomerenvironment.  Reproductive health. Screening  You may have the following tests or measurements:  Height, weight, and BMI.  Blood pressure.  Lipid and cholesterol levels. These may be checked every 5 years, or more frequently if you are over 78 years old.  Skin check.  Lung cancer screening. You may have this screening every year starting at age 78 if you have a 30-pack-year history of smoking and currently smoke or have quit within the past 15 years.  Fecal occult blood test (FOBT) of the stool. You may have this test every year starting at age 78.  Flexible sigmoidoscopy or colonoscopy. You may have a sigmoidoscopy every 5 years or a colonoscopy every 10 years starting at age 78.  Hepatitis C blood test.  Hepatitis B blood test.  Sexually transmitted disease (STD) testing.  Diabetes screening. This is done by checking your blood sugar (glucose) after you have not eaten for a while (fasting). You may have this done every 1-3 years.  Bone density scan. This is done to screen for osteoporosis. You may have this done starting at age 78.  Mammogram. This may be done every 1-2 years. Talk to your health care provider about how often you should have regular mammograms. Talk with your health care provider about your test results, treatment options, and if necessary, the need for more tests. Vaccines  Your health care provider  may recommend certain vaccines, such as:  Influenza vaccine. This is recommended every year.  Tetanus, diphtheria, and acellular pertussis (Tdap, Td) vaccine. You may need a Td booster every 10 years.  Zoster vaccine.  You may need this after age 18.  Pneumococcal 13-valent conjugate (PCV13) vaccine. One dose is recommended after age 78.  Pneumococcal polysaccharide (PPSV23) vaccine. One dose is recommended after age 78. Talk to your health care provider about which screenings and vaccines you need and how often you need them. This information is not intended to replace advice given to you by your health care provider. Make sure you discuss any questions you have with your health care provider. Document Released: 03/14/2015 Document Revised: 11/05/2015 Document Reviewed: 12/17/2014 Elsevier Interactive Patient Education  2017 [place] Prevention in the Home Falls can cause injuries. They can happen to people of all ages. There are many things you can do to make your home safe and to help prevent falls. What can I do on the outside of my home?  Regularly fix the edges of walkways and driveways and fix any cracks.  Remove anything that might make you trip as you walk through a door, such as a raised step or threshold.  Trim any bushes or trees on the path to your home.  Use bright outdoor lighting.  Clear any walking paths of anything that might make someone trip, such as rocks or tools.  Regularly check to see if handrails are loose or broken. Make sure that both sides of any steps have handrails.  Any raised decks and porches should have guardrails on the edges.  Have any leaves, snow, or ice cleared regularly.  Use sand or salt on walking paths during winter.  Clean up any spills in your garage right away. This includes oil or grease spills. What can I do in the bathroom?  Use night lights.  Install grab bars by the toilet and in the tub and shower. Do not use towel bars as grab bars.  Use non-skid mats or decals in the tub or shower.  If you need to sit down in the shower, use a plastic, non-slip stool.  Keep the floor dry. Clean up any water that spills on the floor as soon  as it happens.  Remove soap buildup in the tub or shower regularly.  Attach bath mats securely with double-sided non-slip rug tape.  Do not have throw rugs and other things on the floor that can make you trip. What can I do in the bedroom?  Use night lights.  Make sure that you have a light by your bed that is easy to reach.  Do not use any sheets or blankets that are too big for your bed. They should not hang down onto the floor.  Have a firm chair that has side arms. You can use this for support while you get dressed.  Do not have throw rugs and other things on the floor that can make you trip. What can I do in the kitchen?  Clean up any spills right away.  Avoid walking on wet floors.  Keep items that you use a lot in easy-to-reach places.  If you need to reach something above you, use a strong step stool that has a grab bar.  Keep electrical cords out of the way.  Do not use floor polish or wax that makes floors slippery. If you must use wax, use non-skid floor wax.  Do not have throw rugs  and other things on the floor that can make you trip. What can I do with my stairs?  Do not leave any items on the stairs.  Make sure that there are handrails on both sides of the stairs and use them. Fix handrails that are broken or loose. Make sure that handrails are as long as the stairways.  Check any carpeting to make sure that it is firmly attached to the stairs. Fix any carpet that is loose or worn.  Avoid having throw rugs at the top or bottom of the stairs. If you do have throw rugs, attach them to the floor with carpet tape.  Make sure that you have a light switch at the top of the stairs and the bottom of the stairs. If you do not have them, ask someone to add them for you. What else can I do to help prevent falls?  Wear shoes that:  Do not have high heels.  Have rubber bottoms.  Are comfortable and fit you well.  Are closed at the toe. Do not wear sandals.  If  you use a stepladder:  Make sure that it is fully opened. Do not climb a closed stepladder.  Make sure that both sides of the stepladder are locked into place.  Ask someone to hold it for you, if possible.  Clearly mark and make sure that you can see:  Any grab bars or handrails.  First and last steps.  Where the edge of each step is.  Use tools that help you move around (mobility aids) if they are needed. These include:  Canes.  Walkers.  Scooters.  Crutches.  Turn on the lights when you go into a dark area. Replace any light bulbs as soon as they burn out.  Set up your furniture so you have a clear path. Avoid moving your furniture around.  If any of your floors are uneven, fix them.  If there are any pets around you, be aware of where they are.  Review your medicines with your doctor. Some medicines can make you feel dizzy. This can increase your chance of falling. Ask your doctor what other things that you can do to help prevent falls. This information is not intended to replace advice given to you by your health care provider. Make sure you discuss any questions you have with your health care provider. Document Released: 12/12/2008 Document Revised: 07/24/2015 Document Reviewed: 03/22/2014 Elsevier Interactive Patient Education  2017 [employer].

## 2016-07-21 NOTE — Progress Notes (Signed)
Patient: Erica Smith Female    DOB: 03-Apr-1940   76 y.o.   MRN: [id number] Visit Date: 07/21/2016  Today's Provider: Mila Merry, MD   Chief Complaint  Patient presents with  . Annual Exam  . Hypertension  . Hyperlipidemia   Subjective:    HPI  Erica Smith is a 76 y.o. female who presents today for health maintenance and complete physical. She feels fairly well. She reports exercising none. She reports she is sleeping fairly well.    Hypertension, follow-up:  BP Readings from Last 3 Encounters:  04/19/16 (!) 150/100  12/04/15 (!) 193/84  09/09/15 134/76    She was last seen for hypertension 1 years ago.  BP at that visit was 130/70. Management since that visit includes; no changes.She reports good compliance with treatment. She is not having side effects. none She is exercising. She is adherent to low salt diet.   Outside blood pressures are 145/80. She is experiencing none.  Patient denies none.   Cardiovascular risk factors include advanced age (older than 7 for men, 41 for women).  Use of agents associated with hypertension: none.   ----------------------------------------------------------------    Lipid/Cholesterol, Follow-up:   Last seen for this 1 years ago.  Management since that visit includes; labs checked, no changes.  Last Lipid Panel:    Component Value Date/Time   CHOL 180 07/24/2015 0924   TRIG 287 (H) 07/24/2015 0924   HDL 45 07/24/2015 0924   CHOLHDL 4.0 07/24/2015 0924   LDLCALC 78 07/24/2015 0924    She reports good compliance with treatment. She is not having side effects. none  Wt Readings from Last 3 Encounters:  04/19/16 169 lb (76.7 kg)  12/04/15 172 lb (78 kg)  09/09/15 174 lb (78.9 kg)    ----------------------------------------------------------------  Osteoporosis From 07/13/2015-BMD checked, showed borderline for osteoporosis in hip and spine. She should STOP Boniva for 12 months. Need to continue  calcium and vitamin D.  She continues to take low dose of ambien every nigh and states she sleeps very well with this.     Allergies  Allergen Reactions  . Alendronate Sodium     Pt denies   . Fish Oil     upset stomach, pt denies   . Fluticasone Propionate     Other reaction(s): Dizzyness, pt denies   . Lovastatin     Pt denies   . Bextra [Valdecoxib] Rash  . Ceftin [Cefuroxime Axetil] Rash  . Celebrex [Celecoxib] Rash  . Ciprofloxacin Rash  . Phazyme [Simethicone] Rash  . Sulfamethoxazole-Trimethoprim Rash          Current Outpatient Prescriptions:  .  AMITIZA 8 MCG capsule, TAKE 1 CAPSULE EVERY DAY, Disp: 90 capsule, Rfl: 3 .  aspirin 81 MG tablet, , Disp: , Rfl:  .  Calcium Carb-Cholecalciferol (CALTRATE 600+D) 600-800 MG-UNIT TABS, Take by mouth., Disp: , Rfl:  .  Glucosamine-Chondroit-Vit C-Mn (GLUCOSAMINE CHONDR 1500 COMPLX) CAPS, Take by mouth., Disp: , Rfl:  .  hydrochlorothiazide (HYDRODIURIL) 25 MG tablet, TAKE 1 TABLET EVERY DAY, Disp: 90 tablet, Rfl: 3 .  lansoprazole (PREVACID) 30 MG capsule, TAKE 1 CAPSULE EVERY DAY, Disp: 90 capsule, Rfl: 4 .  LIDODERM 5 %, APPLY 1 PATCH FOR 12 HOURS IN A 24 HOUR PERIOD, Disp: , Rfl: 3 .  metoprolol succinate (TOPROL XL) 100 MG 24 hr tablet, Take 1 1/2 tablets daily, Disp: 135 tablet, Rfl: 4 .  MULTIPLE VITAMIN PO, , Disp: ,  Rfl:  .  nortriptyline (PAMELOR) 50 MG capsule, Take 2 capsules (100 mg total) by mouth at bedtime., Disp: 180 capsule, Rfl: 3 .  oxyCODONE-acetaminophen (ROXICET) 5-325 MG tablet, Take 1 tablet by mouth every 6 (six) hours as needed., Disp: 12 tablet, Rfl: 0 .  simvastatin (ZOCOR) 40 MG tablet, TAKE 1 TABLET EVERY DAY, Disp: 90 tablet, Rfl: 1 .  triamcinolone (NASACORT AQ) 55 MCG/ACT AERO nasal inhaler, Place into the nose., Disp: , Rfl:  .  zolpidem (AMBIEN CR) 12.5 MG CR tablet, Take 1 tablet (12.5 mg total) by mouth at bedtime as needed for sleep., Disp: 30 tablet, Rfl: 5  Review of Systems    Constitutional: Negative.   HENT: Positive for postnasal drip, sinus pressure, sneezing and tinnitus.   Eyes: Negative.   Respiratory: Negative.   Cardiovascular: Negative.   Gastrointestinal: Positive for constipation.  Endocrine: Positive for polyuria.  Genitourinary: Negative.   Musculoskeletal: Positive for arthralgias, back pain and joint swelling.  Skin: Negative.   Allergic/Immunologic: Positive for environmental allergies.  Neurological: Negative.   Hematological: Negative.   Psychiatric/Behavioral: Negative.     Social History  Substance Use Topics  . Smoking status: Never Smoker  . Smokeless tobacco: Never Used  . Alcohol use No   Objective:  Vital Signs - Last Recorded  Most recent update: 07/21/2016 1:39 PM by Hyacinth MeekerMarkoski, McKenzie, LPN  BP    829/56164/88 (BP Location: Right Arm)     Pulse  88     Temp  97.8 F (36.6 C) (Oral)     Ht  5\' 4"  (1.626 m)     Wt  168 lb 3.2 oz (76.3 kg)      BMI  28.87 kg/m    BMI and BSA Data   Body Mass Index: 28.87 kg/m Body Surface Area: 1.86 m       Physical Exam   General Appearance:    Alert, cooperative, no distress, appears stated age  Head:    Normocephalic, without obvious abnormality, atraumatic  Eyes:    PERRL, conjunctiva/corneas clear, EOM's intact, fundi    benign, both eyes  Ears:    Normal TM's and external ear canals, both ears  Nose:   Nares normal, septum midline, mucosa normal, no drainage    or sinus tenderness  Throat:   Lips, mucosa, and tongue normal; teeth and gums normal  Neck:   Supple, symmetrical, trachea midline, no adenopathy;    thyroid:  no enlargement/tenderness/nodules; no carotid   bruit or JVD  Back:     Symmetric, no curvature, ROM normal, no CVA tenderness  Lungs:     Clear to auscultation bilaterally, respirations unlabored  Chest Wall:    No tenderness or deformity   Heart:    Regular rate and rhythm, S1 and S2 normal, no murmur, rub   or gallop  Breast Exam:     normal appearance, no masses or tenderness  Abdomen:     Soft, non-tender, bowel sounds active all four quadrants,    no masses, no organomegaly  Pelvic:    deferred  Extremities:   Extremities normal, atraumatic, no cyanosis or edema  Pulses:   2+ and symmetric all extremities  Skin:   Skin color, texture, turgor normal, no rashes or lesions  Lymph nodes:   Cervical, supraclavicular, and axillary nodes normal  Neurologic:   CNII-XII intact, normal strength, sensation and reflexes    throughout       Assessment & Plan:  1. Annual physical exam Generally doing well.   2. Essential (primary) hypertension Well controlled.  Continue current medications.    3. Cardiac arrhythmia, unspecified cardiac arrhythmia type Continue metoprolol   4. Osteoporosis without current pathological fracture, unspecified osteoporosis type On drug holiday since June 2017. Anticipate repeat BMD in 2019  5. Hyperlipidemia, unspecified hyperlipidemia type She is tolerating simvastatin well with no adverse effects.   - Lipid panel - Comprehensive metabolic panel  6. Chronic constipation Farily well controlled on Amitiza.   7. Insomnia Well controlled with no adverse effects on zolpidem.        Mila Merry, MD  [facility]

## 2016-07-21 NOTE — Progress Notes (Signed)
Subjective:   Erica ClicheFrances E Smith is a 76 y.o. female who presents for Medicare Annual (Subsequent) preventive examination.  Review of Systems:  N/A  Cardiac Risk Factors include: advanced age (>1755men, 38>65 women);dyslipidemia;hypertension     Objective:     Vitals: BP (!) 164/88 (BP Location: Right Arm)   Pulse 88   Temp 97.8 F (36.6 C) (Oral)   Ht 5\' 4"  (1.626 m)   Wt 168 lb 3.2 oz (76.3 kg)   BMI 28.87 kg/m   Body mass index is 28.87 kg/m.   Tobacco History  Smoking Status  . Never Smoker  Smokeless Tobacco  . Never Used     Counseling given: Not Answered   Past Medical History:  Diagnosis Date  . Bursitis of left hip   . Depression   . GERD (gastroesophageal reflux disease)   . Hypercholesteremia   . Hypertension   . Osteoporosis   . Pleural effusion 2008   after hip surgery  . Seasonal allergies   . Swelling of left lower extremity   . Wears dentures    partial upper   Past Surgical History:  Procedure Laterality Date  . BREAST BIOPSY Bilateral    neg  . BREAST SURGERY Bilateral    cyst removal  . CATARACT EXTRACTION Left   . CATARACT EXTRACTION W/PHACO Right 12/18/2014   Procedure: CATARACT EXTRACTION PHACO AND INTRAOCULAR LENS PLACEMENT (IOC);  Surgeon: Lockie Molahadwick Brasington, MD;  [facility];  Service: Ophthalmology;  Laterality: Right;  . JOINT REPLACEMENT Left 2008   hip  . OVARIAN CYST REMOVAL    . TONSILLECTOMY    . TOTAL HIP REVISION Left 2008   Family History  Problem Relation Age of Onset  . Heart disease Mother   . Diabetes Mother   . Hypertension Mother   . Hypertension Father   . Cancer Brother        throat and liver  . Breast cancer Neg Hx    History  Sexual Activity  . Sexual activity: Not on file    Outpatient Encounter Prescriptions as of 07/21/2016  Medication Sig  . AMITIZA 8 MCG capsule TAKE 1 CAPSULE EVERY DAY  . aspirin 81 MG tablet   . Calcium Carb-Cholecalciferol (CALTRATE 600+D) 600-800  MG-UNIT TABS Take 2 capsules by mouth daily.   . Cholecalciferol (VITAMIN D3) 1000 units CAPS Take 1 capsule by mouth 2 (two) times daily.  . Glucosamine-Chondroit-Vit C-Mn (GLUCOSAMINE CHONDR 1500 COMPLX) CAPS Take by mouth daily.   . hydrochlorothiazide (HYDRODIURIL) 25 MG tablet TAKE 1 TABLET EVERY DAY  . lansoprazole (PREVACID) 30 MG capsule TAKE 1 CAPSULE EVERY DAY  . LIDODERM 5 % APPLY 1 PATCH FOR 12 HOURS IN A 24 HOUR PERIOD as needed  . metoprolol succinate (TOPROL XL) 100 MG 24 hr tablet Take 1 1/2 tablets daily  . MULTIPLE VITAMIN PO   . nortriptyline (PAMELOR) 50 MG capsule Take 2 capsules (100 mg total) by mouth at bedtime.  . simvastatin (ZOCOR) 40 MG tablet TAKE 1 TABLET EVERY DAY  . triamcinolone (NASACORT AQ) 55 MCG/ACT AERO nasal inhaler Place 1 spray into the nose daily.   Marland Kitchen. zolpidem (AMBIEN CR) 12.5 MG CR tablet Take 1 tablet (12.5 mg total) by mouth at bedtime as needed for sleep.  Marland Kitchen. oxyCODONE-acetaminophen (ROXICET) 5-325 MG tablet Take 1 tablet by mouth every 6 (six) hours as needed. (Patient not taking: Reported on 07/21/2016)   No facility-administered encounter medications on file as of 07/21/2016.  Activities of Daily Living In your present state of health, do you have any difficulty performing the following activities: 07/21/2016 07/23/2015  Hearing? Malvin Johns  Vision? N N  Difficulty concentrating or making decisions? Y N  Walking or climbing stairs? Y Y  Dressing or bathing? N N  Doing errands, shopping? N N  Preparing Food and eating ? N -  Using the Toilet? N -  In the past six months, have you accidently leaked urine? N -  Do you have problems with loss of bowel control? N -  Managing your Medications? N -  Managing your Finances? N -  Housekeeping or managing your Housekeeping? N -  Some recent data might be hidden    Patient Care Team: Malva Limes, MD as PCP - General (Family Medicine) Wilhemina Bonito, MD (Physical Medicine and  Rehabilitation) Candice Camp, MD as Consulting Physician (Ophthalmology)    Assessment:     Exercise Activities and Dietary recommendations Current Exercise Habits: The patient does not participate in regular exercise at present (active during the day but nothing structured), Exercise limited by: Other - see comments (caregiver to husband)  Goals    . Increase water intake          Recommend increasing water intake to 3-4 glasses a day.      Fall Risk Fall Risk  07/21/2016 07/23/2015  Falls in the past year? No Yes  Number falls in past yr: - 1  Injury with Fall? - Yes   Depression Screen PHQ 2/9 Scores 07/21/2016 07/23/2015  PHQ - 2 Score 0 0  PHQ- 9 Score - 2     Cognitive Function     6CIT Screen 07/21/2016  What Year? 0 points  What month? 0 points  What time? 0 points  Count back from 20 0 points  Months in reverse 0 points  Repeat phrase 4 points  Total Score 4    Immunization History  Administered Date(s) Administered  . Influenza, High Dose Seasonal PF 11/21/2014, 11/21/2015  . Influenza-Unspecified 10/30/2013  . Pneumococcal Conjugate-13 12/14/2013  . Pneumococcal Polysaccharide-23 12/28/2003  . Td 08/30/2000   Screening Tests Health Maintenance  Topic Date Due  . TETANUS/TDAP  03/01/2026 (Originally 08/31/2010)  . INFLUENZA VACCINE  09/29/2016  . DEXA SCAN  Completed  . PNA vac Low Risk Adult  Completed      Plan:  I have personally reviewed and addressed the Medicare Annual Wellness questionnaire and have noted the following in the patient's chart:  A. Medical and social history B. Use of alcohol, tobacco or illicit drugs  C. Current medications and supplements D. Functional ability and status E.  Nutritional status F.  Physical activity G. Advance directives H. List of other physicians I.  Hospitalizations, surgeries, and ER visits in previous 12 months J.  Vitals K. Screenings such as hearing and vision if needed, cognitive and  depression L. Referrals and appointments - none  In addition, I have reviewed and discussed with patient certain preventive protocols, quality metrics, and best practice recommendations. A written personalized care plan for preventive services as well as general preventive health recommendations were provided to patient.  See attached scanned questionnaire for additional information.   Signed,  Hyacinth Meeker, LPN Nurse Health Advisor   MD Recommendations: None, pt declined tetanus vaccine today.  I have reviewed the health advisor's note, was available for consultation, and agree with documentation and plan  Mila Merry, MD

## 2016-07-23 DIAGNOSIS — E785 Hyperlipidemia, unspecified: Secondary | ICD-10-CM | POA: Diagnosis not present

## 2016-07-24 LAB — COMPREHENSIVE METABOLIC PANEL
A/G RATIO: 1.4 (ref 1.2–2.2)
ALT: 24 IU/L (ref 0–32)
AST: 22 IU/L (ref 0–40)
Albumin: 4.2 g/dL (ref 3.5–4.8)
Alkaline Phosphatase: 65 IU/L (ref 39–117)
BILIRUBIN TOTAL: 0.3 mg/dL (ref 0.0–1.2)
BUN/Creatinine Ratio: 24 (ref 12–28)
BUN: 17 mg/dL (ref 8–27)
CHLORIDE: 96 mmol/L (ref 96–106)
CO2: 28 mmol/L (ref 18–29)
Calcium: 10.6 mg/dL — ABNORMAL HIGH (ref 8.7–10.3)
Creatinine, Ser: 0.71 mg/dL (ref 0.57–1.00)
GFR calc non Af Amer: 82 mL/min/{1.73_m2} (ref >59)
GFR, EST AFRICAN AMERICAN: 95 mL/min/{1.73_m2} (ref >59)
Globulin, Total: 3 g/dL (ref 1.5–4.5)
Glucose: 89 mg/dL (ref 65–99)
POTASSIUM: 4.1 mmol/L (ref 3.5–5.2)
SODIUM: 139 mmol/L (ref 134–144)
TOTAL PROTEIN: 7.2 g/dL (ref 6.0–8.5)

## 2016-07-24 LAB — LIPID PANEL
CHOL/HDL RATIO: 4.2 ratio (ref 0.0–4.4)
Cholesterol, Total: 179 mg/dL (ref 100–199)
HDL: 43 mg/dL (ref >39)
LDL CALC: 80 mg/dL (ref 0–99)
TRIGLYCERIDES: 281 mg/dL — AB (ref 0–149)
VLDL Cholesterol Cal: 56 mg/dL — ABNORMAL HIGH (ref 5–40)

## 2016-07-27 ENCOUNTER — Telehealth: Payer: Self-pay

## 2016-07-27 NOTE — Telephone Encounter (Signed)
-----   Message from Malva Limesonald E Fisher, MD sent at 07/24/2016  8:33 AM EDT ----- Blood sugar, kidney functions, electrolytes are all normal.Continue current medications.  Check yearly.

## 2016-07-27 NOTE — Telephone Encounter (Signed)
Patient has been advised. KW 

## 2016-08-02 ENCOUNTER — Other Ambulatory Visit: Payer: Self-pay | Admitting: Family Medicine

## 2016-08-02 ENCOUNTER — Other Ambulatory Visit: Payer: Self-pay

## 2016-08-02 NOTE — Telephone Encounter (Signed)
Refill request from patient on Ambien-aa

## 2016-08-03 MED ORDER — ZOLPIDEM TARTRATE ER 12.5 MG PO TBCR: 12.5000 mg | EXTENDED_RELEASE_TABLET | Freq: Every evening | ORAL | 5 refills | Status: DC | PRN

## 2016-08-03 NOTE — Telephone Encounter (Signed)
rx called in-aa 

## 2016-08-03 NOTE — Telephone Encounter (Signed)
Please call in zolpidem  

## 2016-09-07 ENCOUNTER — Other Ambulatory Visit: Payer: Self-pay | Admitting: Family Medicine

## 2016-12-09 ENCOUNTER — Ambulatory Visit: Payer: Medicare PPO

## 2016-12-10 ENCOUNTER — Ambulatory Visit: Payer: Medicare PPO | Admitting: Family Medicine

## 2016-12-20 ENCOUNTER — Other Ambulatory Visit: Payer: Self-pay | Admitting: Family Medicine

## 2017-01-26 IMAGING — CR DG HIP COMPLETE 2+V*L*
4 series · 4 of 4 positions shown · non-contrast
Comparison: Left hip films of 01/28/2008

CLINICAL DATA: Fell landing on left side with posterior left hip
pain

EXAM:
LEFT HIP (WITH PELVIS) 2-3 VIEWS

[pelvis ap (1 of 2)]
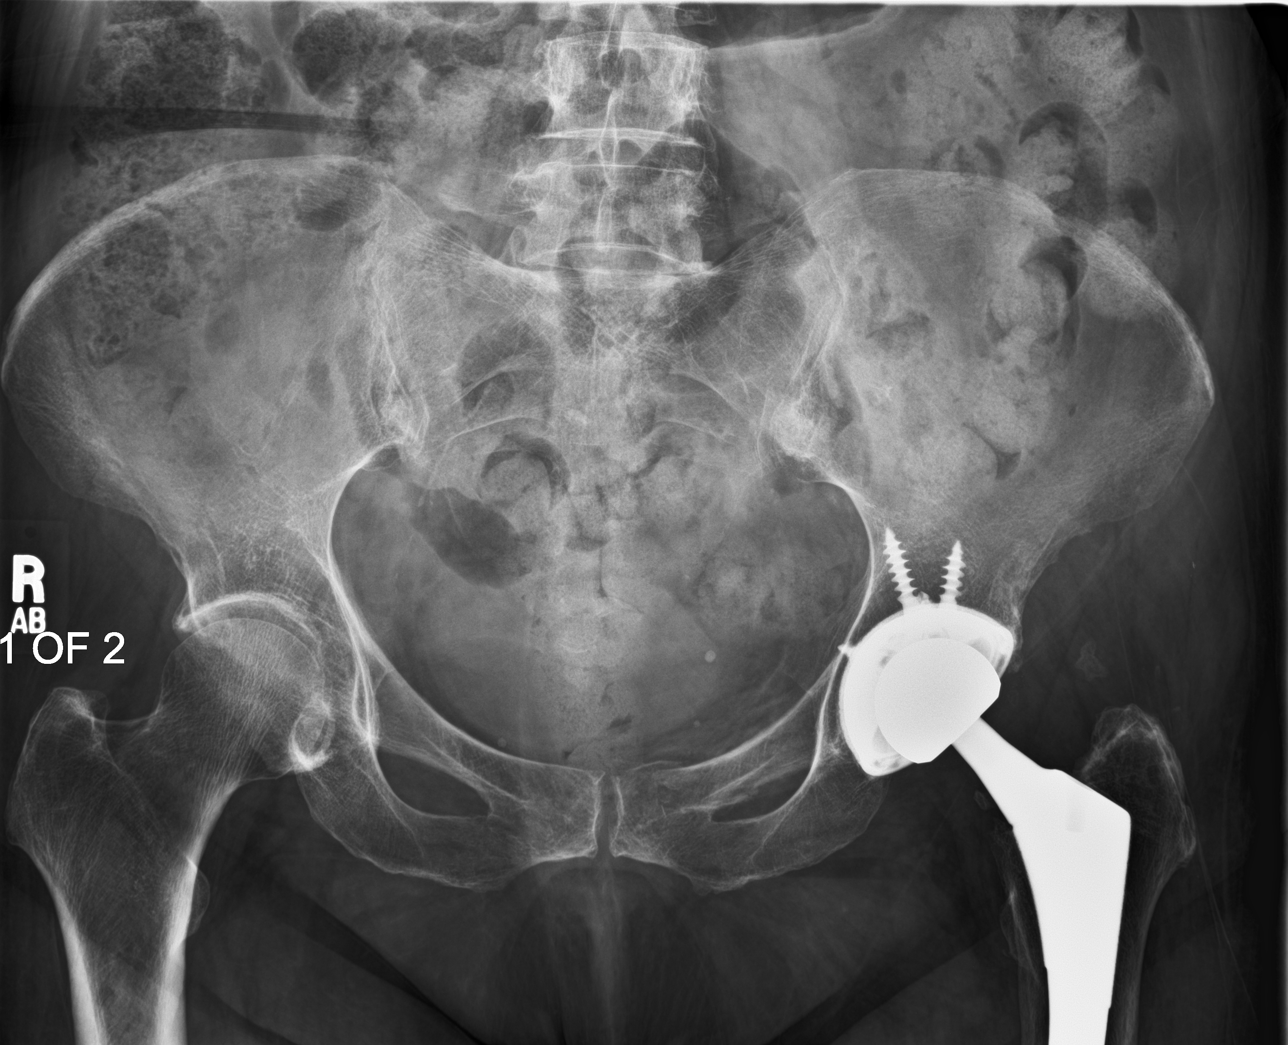

[pelvis ap (2 of 2)]
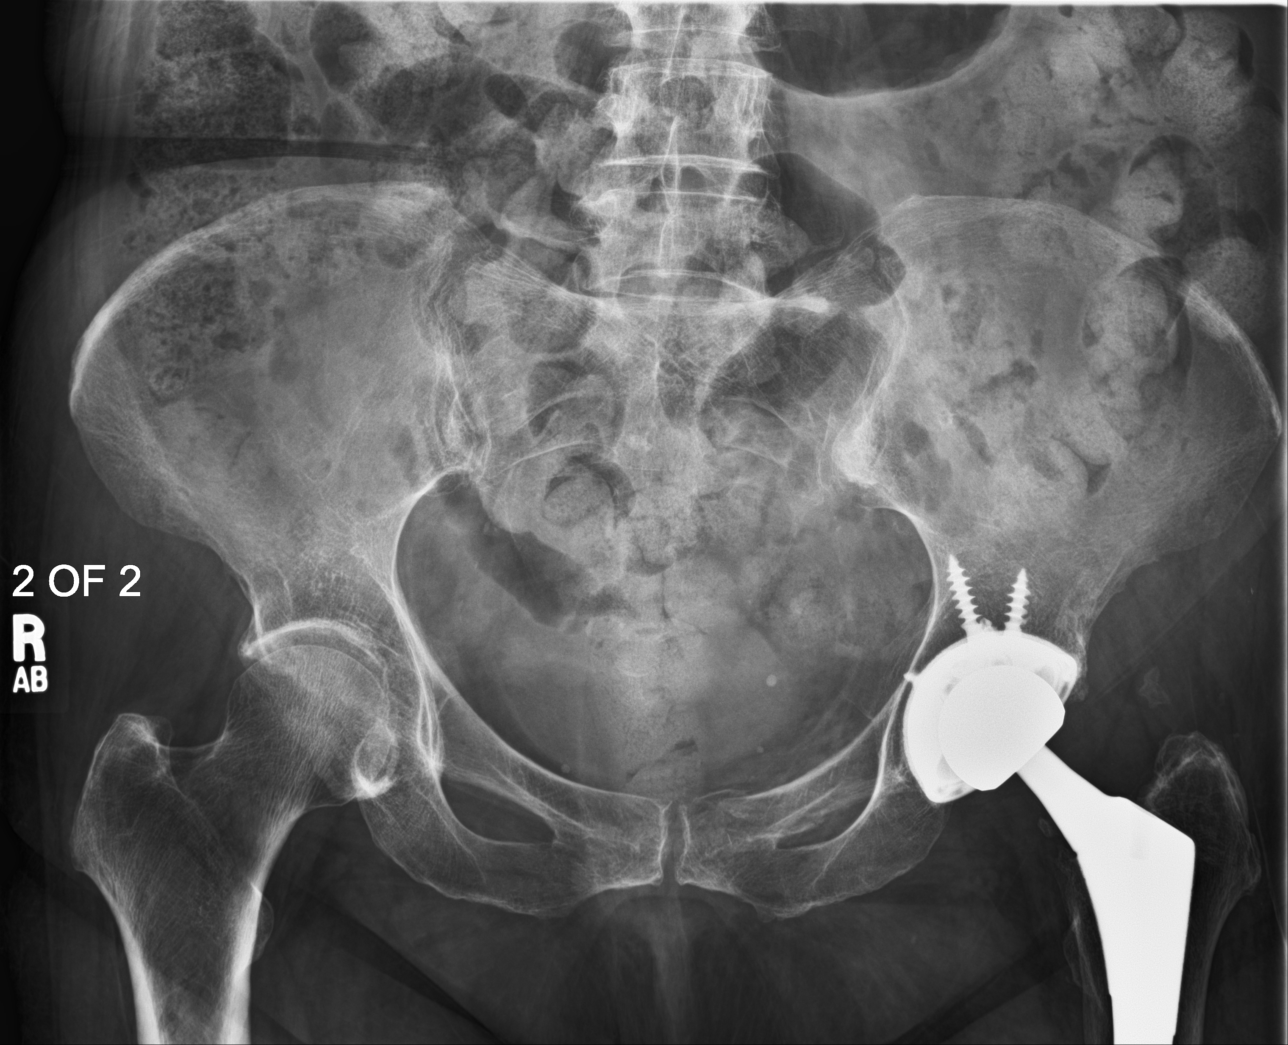

[hip ap]
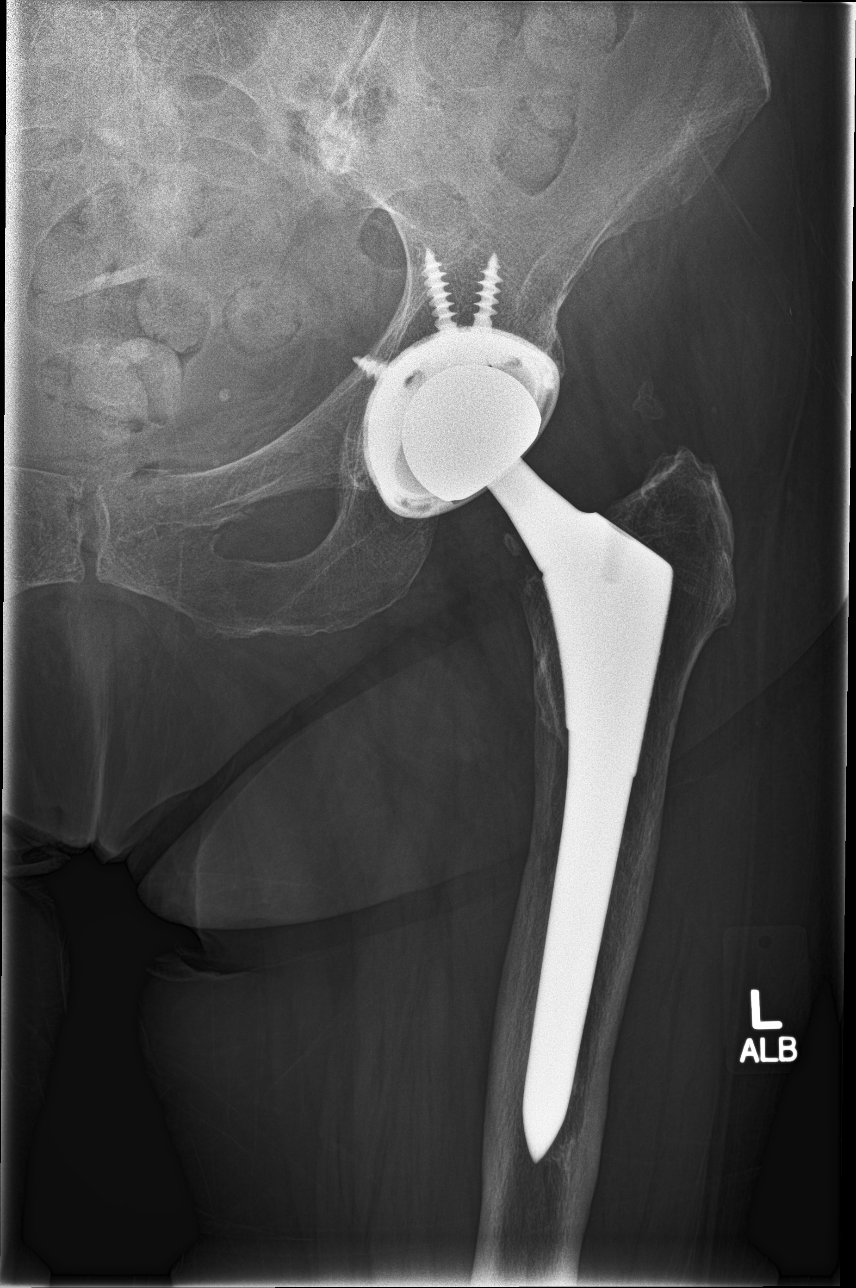

[hip lat]
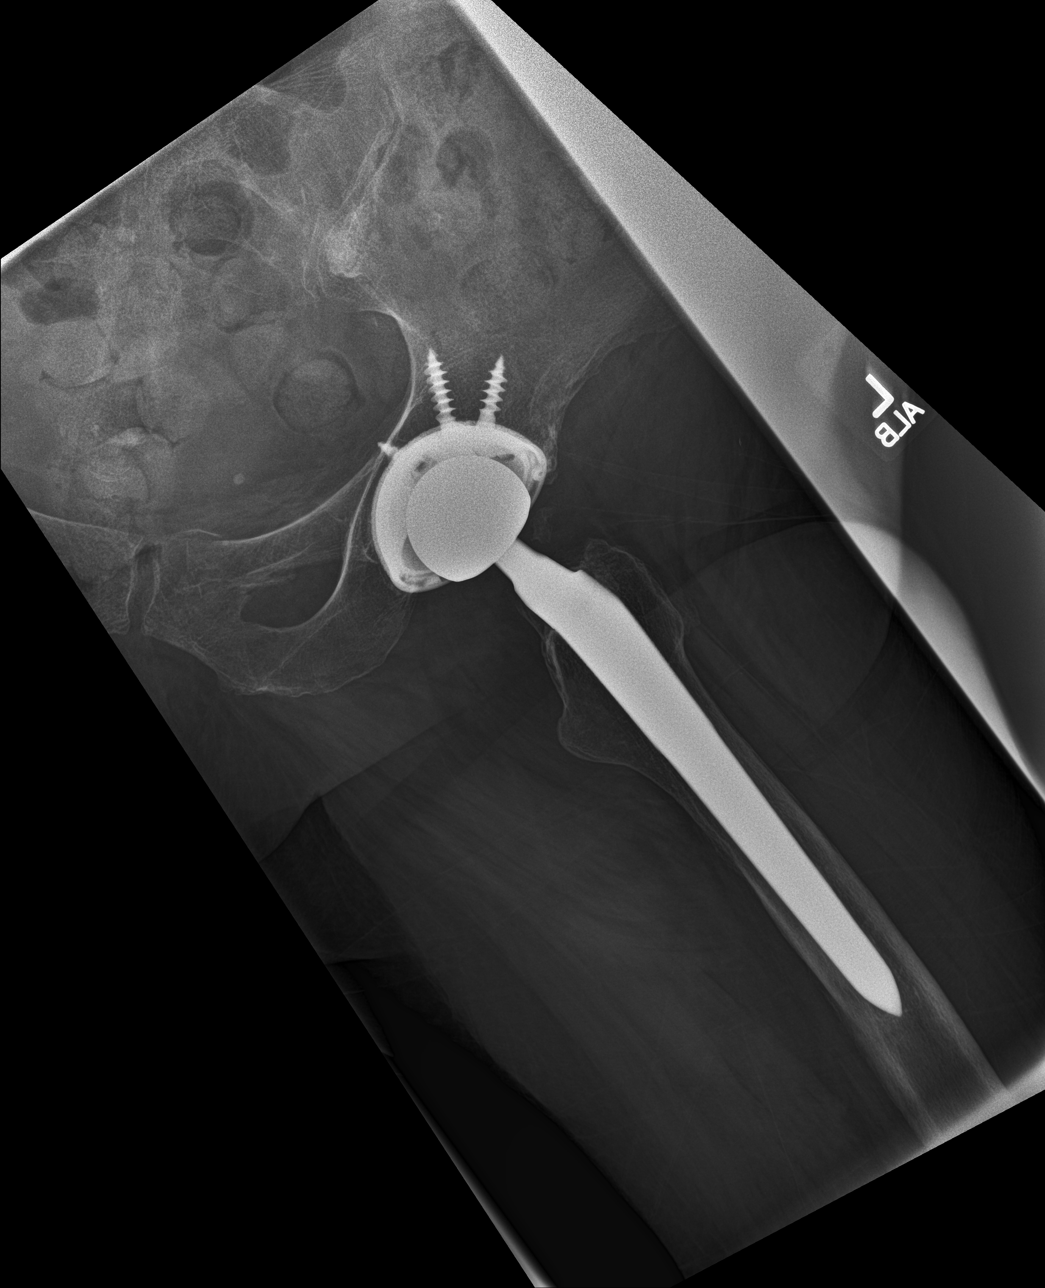

[4 of 4 positions shown; findings below may reference images not displayed]

FINDINGS: The acetabular and femoral components of the left total hip
replacement are in good position and alignment with no acute
abnormality. However, there does appear to be cortical irregularity
of the left superior pubic ramus consistent with fracture without
displacement. No other acute abnormality is seen. The SI joints
appear corticated.
IMPRESSION: Nondisplaced fracture of the left superior pubic ramus. Left total
hip replacement is noted without acute abnormality

## 2017-01-28 ENCOUNTER — Telehealth: Payer: Self-pay | Admitting: Family Medicine

## 2017-01-28 MED ORDER — ZOLPIDEM TARTRATE ER 12.5 MG PO TBCR: 12.5000 mg | EXTENDED_RELEASE_TABLET | Freq: Every evening | ORAL | 5 refills | Status: DC | PRN

## 2017-01-28 NOTE — Telephone Encounter (Signed)
Pt contacted office for refill request on the following medications:  zolpidem (AMBIEN CR) 12.5 MG CR tablet   [facility] [place].  WU#[phone]/[id number]#[phone]/MW

## 2017-03-28 ENCOUNTER — Telehealth: Payer: Self-pay | Admitting: Family Medicine

## 2017-03-28 NOTE — Telephone Encounter (Signed)
Pt is requesting call back to discuss her insurance denial of coverage for zolpidem (AMBIEN CR) 12.5 MG CR tablet. Pt stated that she has tried all other medications and this is the only medication that has helped her sleep. Pt is asking what can be done to continue taking the medication. Please advise. Thanks TNP

## 2017-03-28 NOTE — Telephone Encounter (Signed)
Please advise 

## 2017-03-30 NOTE — Telephone Encounter (Signed)
Pt stated that she got [facility] her zolpidem (AMBIEN CR) 12.5 MG CR tablet. Pt stated the letter stated the medication had been denied and that they require more information from our office. Pt stated she is concerned that she won't have this medication and is requesting a call back. Pt stated that she only has 3 pills left. Please advise. Thanks TNP

## 2017-04-01 NOTE — Telephone Encounter (Signed)
Patient was advised.  

## 2017-04-01 NOTE — Telephone Encounter (Signed)
Please advise patient of status of prior authorization. Thanks.

## 2017-04-01 NOTE — Telephone Encounter (Signed)
Patient is calling to check on this.  She states that she only has one pill left.

## 2017-04-06 ENCOUNTER — Telehealth: Payer: Self-pay | Admitting: *Deleted

## 2017-04-06 DIAGNOSIS — G4700 Insomnia, unspecified: Secondary | ICD-10-CM

## 2017-04-06 NOTE — Telephone Encounter (Signed)
PA for zolpidem 12.5 mg was denied the second time. [facility], trying and failing Bels[facility]a. Also being on taper dose.

## 2017-04-07 NOTE — Telephone Encounter (Signed)
Patient called wanting to know the status of Ambien. I advised patient as below. Patient still wanted to talk with Marcelino DusterMichelle to see why she needs to try another med if the Remus Lofflerambien is what works for her?

## 2017-04-07 NOTE — Telephone Encounter (Signed)
Returned call to patient. Discussed message below.

## 2017-04-08 NOTE — Telephone Encounter (Signed)
Patient wants to know what she needs to do now that her insurance won't cover the zolpidem? Please advise?

## 2017-04-08 NOTE — Telephone Encounter (Signed)
Insurance requires trial of Belsomra 10mg  QHS, #30, rf x 5. Call if not effective after 2 weeks.

## 2017-04-11 ENCOUNTER — Other Ambulatory Visit: Payer: Self-pay | Admitting: Family Medicine

## 2017-04-11 NOTE — Telephone Encounter (Signed)
lmtcb

## 2017-04-12 MED ORDER — SUVOREXANT 10 MG PO TABS: 1.0000 | ORAL_TABLET | Freq: Every day | ORAL | 5 refills | Status: DC

## 2017-04-12 NOTE — Telephone Encounter (Signed)
Patient wanted me to let Erica Smith know that she is going to keep taking zolpidem and just pay for it and not run it through her insurance.  She states that she has three refills on it right now and will call back when she needs more.  If you have any questions you can call her back.

## 2017-04-12 NOTE — Telephone Encounter (Signed)
Called [place] into the patient's local pharmacy. She must try this first before insurance will cover Ambien. Call if not improving in 2 weeks.

## 2017-04-21 ENCOUNTER — Telehealth: Payer: Self-pay | Admitting: Family Medicine

## 2017-04-21 MED ORDER — LINACLOTIDE 145 MCG PO CAPS: 145.0000 ug | ORAL_CAPSULE | Freq: Every day | ORAL | 4 refills | Status: DC

## 2017-04-21 NOTE — Telephone Encounter (Signed)
-----   Message from Bridgett Larssonaroline D Couch sent at 04/15/2017 12:24 PM EST ----- Review incoming fax

## 2017-04-21 NOTE — Telephone Encounter (Signed)
Please advise patient that her insurance denied amitiza. They require a trial of Linzess which usually works just as [employer].have sent prescription for [employer]ocoLinzess to her mail order pharmacy.

## 2017-04-21 NOTE — Telephone Encounter (Signed)
LMOVM for pt to return call 

## 2017-04-21 NOTE — Telephone Encounter (Signed)
Advised patient as below.  

## 2017-05-09 ENCOUNTER — Telehealth: Payer: Self-pay | Admitting: Family Medicine

## 2017-05-09 NOTE — Telephone Encounter (Signed)
Please advise 

## 2017-05-09 NOTE — Telephone Encounter (Signed)
Been taking Linzess for 2 weeks. Having increased lower back pain, leg pain, diarrhea and horrible stomach cramps.

## 2017-05-09 NOTE — Telephone Encounter (Signed)
Must be too high of a dose for her. She should stop for 1-2 days until symptoms resolve, then restart with 1/2 tablet daily.

## 2017-05-10 NOTE — Telephone Encounter (Signed)
Patient was advised. Patient expressed understanding.  

## 2017-06-01 ENCOUNTER — Telehealth: Payer: Self-pay | Admitting: Family Medicine

## 2017-06-01 MED ORDER — LUBIPROSTONE 8 MCG PO CAPS: 8.0000 ug | ORAL_CAPSULE | Freq: Every day | ORAL | 3 refills | Status: DC

## 2017-06-01 NOTE — Telephone Encounter (Signed)
Pt calling to request a refill on the following medication. Pt states she was in office last week with husband and talked with provider about switching her medication to Amitiza. [facility]  Thanks CC  90-days supply AMITIZA 8 MCG capsule

## 2017-06-01 NOTE — Telephone Encounter (Signed)
Please review.Ok to send?

## 2017-06-08 ENCOUNTER — Telehealth: Payer: Self-pay | Admitting: Family Medicine

## 2017-06-08 NOTE — Telephone Encounter (Signed)
Need to get info from pharmacy for prior auth

## 2017-06-08 NOTE — Telephone Encounter (Signed)
Patient called stating [employer] called her this morning stating that the Amitiza has to be prior authorized.   Please complete.

## 2017-06-08 NOTE — Telephone Encounter (Signed)
Please advise 

## 2017-06-09 NOTE — Telephone Encounter (Addendum)
Per covermymeds.com PA for Amitiza is not necessary due to the fact that there is an EOC that was clinically denied within the last 60 days. This request needs to be sent to the Grievance and Mattelppeals Dept. at [facility]. Appeal requests must come from the member or the provider.

## 2017-06-09 NOTE — Telephone Encounter (Signed)
Will retry after 4/16 which will 60 days after original denial

## 2017-06-20 ENCOUNTER — Other Ambulatory Visit: Payer: Self-pay | Admitting: Family Medicine

## 2017-06-20 NOTE — Telephone Encounter (Signed)
Patient got a letter from ins. Company regarding the prior Chartered loss adjusterauthor. Of Amitiza.   She wanted to know if we ever got it approved.  If not, do we have sample we can give her to try?

## 2017-06-21 NOTE — Telephone Encounter (Signed)
Please advise samples? 

## 2017-06-23 MED ORDER — LUBIPROSTONE 8 MCG PO CAPS: 8.0000 ug | ORAL_CAPSULE | Freq: Every day | ORAL | 3 refills | Status: DC

## 2017-06-23 NOTE — Telephone Encounter (Signed)
Patient was advised. Samples at front desk.

## 2017-06-23 NOTE — Telephone Encounter (Signed)
I sent the prescription to her mail order pharmacy on 4-8, but they never sent prior auth form. I resent prescription today. She can have 4 or 5 boxes of samples if we have them.

## 2017-06-27 ENCOUNTER — Other Ambulatory Visit: Payer: Self-pay | Admitting: Family Medicine

## 2017-06-30 ENCOUNTER — Telehealth: Payer: Self-pay

## 2017-06-30 NOTE — Telephone Encounter (Signed)
LMTCB and schedule AWV and CPE after 07/21/17. -MM

## 2017-07-11 NOTE — Telephone Encounter (Signed)
Pt stated that since she is no longer driving she is waiting on her daughter to let her know dates that she could bring pt. Pt stated once she hears back from her daughter she will call to schedule the appt. Thanks TNP

## 2017-07-13 DIAGNOSIS — H903 Sensorineural hearing loss, bilateral: Secondary | ICD-10-CM | POA: Diagnosis not present

## 2017-07-13 DIAGNOSIS — H6123 Impacted cerumen, bilateral: Secondary | ICD-10-CM | POA: Diagnosis not present

## 2017-07-20 NOTE — Telephone Encounter (Signed)
Pt is scheduled for AWV on 07/27/17 @ 220 pm then scheduled with Dr. Sherrie Mustache at 3 pm. Thanks TNP

## 2017-07-20 NOTE — Telephone Encounter (Signed)
Awesome, thank you 

## 2017-07-27 ENCOUNTER — Ambulatory Visit (INDEPENDENT_AMBULATORY_CARE_PROVIDER_SITE_OTHER): Payer: Medicare PPO | Admitting: Family Medicine

## 2017-07-27 ENCOUNTER — Ambulatory Visit (INDEPENDENT_AMBULATORY_CARE_PROVIDER_SITE_OTHER): Payer: Medicare PPO

## 2017-07-27 ENCOUNTER — Encounter: Payer: Self-pay | Admitting: Family Medicine

## 2017-07-27 VITALS — BP 146/78

## 2017-07-27 VITALS — BP 142/70 | HR 91 | Temp 98.7°F | Ht 64.0 in | Wt 167.8 lb

## 2017-07-27 DIAGNOSIS — Z0000 Encounter for general adult medical examination without abnormal findings: Secondary | ICD-10-CM

## 2017-07-27 DIAGNOSIS — K5909 Other constipation: Secondary | ICD-10-CM | POA: Diagnosis not present

## 2017-07-27 DIAGNOSIS — R221 Localized swelling, mass and lump, neck: Secondary | ICD-10-CM

## 2017-07-27 DIAGNOSIS — E785 Hyperlipidemia, unspecified: Secondary | ICD-10-CM

## 2017-07-27 DIAGNOSIS — I10 Essential (primary) hypertension: Secondary | ICD-10-CM

## 2017-07-27 DIAGNOSIS — M810 Age-related osteoporosis without current pathological fracture: Secondary | ICD-10-CM | POA: Diagnosis not present

## 2017-07-27 NOTE — Progress Notes (Signed)
Patient: Erica Smith, Female    DOB: 1940-04-30, 77 y.o.   MRN: [id number] Visit Date: 07/27/2017  Today's Provider: Mila Merry, MD   Chief Complaint  Patient presents with  . Annual Exam  . Hypertension  . Constipation  . Hyperlipidemia  . Insomnia   Subjective:     Complete Physical Erica Smith is a 77 y.o. female. She feels fairly well. She reports no regular exercising. She reports she is sleeping fairly well.  -----------------------------------------------------------   Hypertension, follow-up:  BP Readings from Last 3 Encounters:  07/27/17 (!) 146/78  07/27/17 (!) 142/70  07/21/16 (!) 144/74    She was last seen for hypertension 1 years ago.  BP at that visit was 164/88. Management since that visit includes; no changes.She reports good compliance with treatment. She is not having side effects.  She is not exercising. She is adherent to low salt diet.   Outside blood pressures are not being checked. She is experiencing lower extremity edema.  Patient denies chest pain, chest pressure/discomfort, claudication, dyspnea, exertional chest pressure/discomfort, fatigue, irregular heart beat, near-syncope, orthopnea, palpitations, paroxysmal nocturnal dyspnea, syncope and tachypnea.   Cardiovascular risk factors include advanced age (older than 44 for men, 48 for women), dyslipidemia and hypertension.  Use of agents associated with hypertension: NSAIDS.   ------------------------------------------------------------------------    Lipid/Cholesterol, Follow-up:   Last seen for this 1 years ago.  Management since that visit includes; labs checked, no changes.  Last Lipid Panel:    Component Value Date/Time   CHOL 179 07/23/2016 0948   TRIG 281 (H) 07/23/2016 0948   HDL 43 07/23/2016 0948   CHOLHDL 4.2 07/23/2016 0948   LDLCALC 80 07/23/2016 0948    She reports good compliance with treatment. She is not having side effects.   Wt  Readings from Last 3 Encounters:  07/27/17 167 lb 12.8 oz (76.1 kg)  07/21/16 168 lb 3.2 oz (76.3 kg)  04/19/16 169 lb (76.7 kg)    ------------------------------------------------------------------------  Osteoporosis without current pathological fracture, unspecified osteoporosis type She had been on Boniva for several years which was discontinued in 2017 for drug holiday. She tolerated Boniva well.   Chronic constipation From 07/21/2016-no changes. Was well controlled on Amitiza. Since then her insurance stopped covering Amitiza. She tried preferred Linzess for a few weeks but it caused severe stomach cramping and was not effective for constipation.   Insomnia From 07/21/2016-no changes. Well controlled with no adverse effects on zolpidem. Today patient reports this problem is stable on current medication.      Review of Systems  Constitutional: Positive for fatigue. Negative for chills, diaphoresis and fever.  HENT: Negative for congestion, ear discharge, ear pain, hearing loss, nosebleeds, sore throat and tinnitus.   Eyes: Negative for photophobia, pain, discharge and redness.  Respiratory: Negative for cough, shortness of breath, wheezing and stridor.   Cardiovascular: Negative for chest pain, palpitations and leg swelling.  Gastrointestinal: Positive for constipation. Negative for abdominal pain, blood in stool, diarrhea, nausea and vomiting.  Endocrine: Negative for polydipsia.  Genitourinary: Negative for dysuria, flank pain, frequency, hematuria and urgency.  Musculoskeletal: Negative for back pain, myalgias and neck pain.  Skin: Negative for rash.  Allergic/Immunologic: Negative for environmental allergies.  Neurological: Negative for dizziness, tremors, seizures, weakness and headaches.  Hematological: Does not bruise/bleed easily.  Psychiatric/Behavioral: Negative for hallucinations and suicidal ideas. The patient is not nervous/anxious.     Social History    Socioeconomic History  .  Marital status: Married    Spouse name: Not on file  . Number of children: 1  . Years of education: Not on file  . Highest education level: 12th grade  Occupational History  . Occupation: retired  Engineer, production  . Financial resource strain: Not hard at all  . Food insecurity:    Worry: Never true    Inability: Never true  . Transportation needs:    Medical: No    Non-medical: No  Tobacco Use  . Smoking status: Never Smoker  . Smokeless tobacco: Never Used  Substance and Sexual Activity  . Alcohol use: No  . Drug use: No  . Sexual activity: Not on file  Lifestyle  . Physical activity:    Days per week: Not on file    Minutes per session: Not on file  . Stress: Only a little  Relationships  . Social connections:    Talks on phone: Not on file    Gets together: Not on file    Attends religious service: Not on file    Active member of club or organization: Not on file    Attends meetings of clubs or organizations: Not on file    Relationship status: Not on file  . Intimate partner violence:    Fear of current or ex partner: Not on file    Emotionally abused: Not on file    Physically abused: Not on file    Forced sexual activity: Not on file  Other Topics Concern  . Not on file  Social History Narrative  . Not on file    Past Medical History:  Diagnosis Date  . Bursitis of left hip   . Depression   . GERD (gastroesophageal reflux disease)   . Hypercholesteremia   . Hypertension   . Osteoporosis   . Pleural effusion 2008   after hip surgery  . Seasonal allergies   . Swelling of left lower extremity   . Wears dentures    partial upper     Patient Active Problem List   Diagnosis Date Noted  . Insomnia 07/21/2016  . Allergic state 07/18/2015  . Cardiac dysrhythmia 07/18/2015  . Arthritis 07/18/2015  . Clinical depression 07/18/2015  . Acid reflux 07/18/2015  . 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 Osteoporosis 09/11/2014   . Fracture of sacrum (HCC) 06/27/2014  . Closed fracture of single pubic ramus of pelvis (HCC) 06/18/2014  . 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 Peripheral nerve disease 01/05/2013  . H/O total hip arthroplasty 11/15/2012  . Irritable bowel syndrome 03/02/2007  . Chronic constipation 09/08/2005  . Hyperlipidemia 03/02/2003  . Essential (primary) hypertension 06/16/1998    Past Surgical History:  Procedure Laterality Date  . BREAST BIOPSY Bilateral    neg  . BREAST SURGERY Bilateral    cyst removal  . CATARACT EXTRACTION Left   . CATARACT EXTRACTION W/PHACO Right 12/18/2014   Procedure: CATARACT EXTRACTION PHACO AND INTRAOCULAR LENS PLACEMENT (IOC);  Surgeon: Lockie Mola, MD;  Location: [facility];  Service: Ophthalmology;  Laterality: Right;  . JOINT REPLACEMENT Left 2008   hip  . OVARIAN CYST REMOVAL    . TONSILLECTOMY    . TOTAL HIP REVISION Left 2008    Her family history includes Cancer in her brother; Diabetes in her mother; Heart attack in her father; Heart disease in her mother; Hypertension in her father, mother, and sister; Stroke in her mother. There is no history of Breast cancer.  Current Outpatient Medications:  .  lubiprostone (AMITIZA) 8 MCG capsule, Take 1 capsule (8 mcg total) by mouth daily., Disp: 90 capsule, Rfl: 3 .  aspirin 81 MG tablet, , Disp: , Rfl:  .  Calcium Carb-Cholecalciferol (CALTRATE 600+D) 600-800 MG-UNIT TABS, Take 2 capsules by mouth daily. , Disp: , Rfl:  .  Cholecalciferol (VITAMIN D3) 1000 units CAPS, Take 1 capsule by mouth 2 (two) times daily., Disp: , Rfl:  .  Glucosamine-Chondroit-Vit C-Mn (GLUCOSAMINE CHONDR 1500 COMPLX) CAPS, Take 1,500 capsules by mouth daily. , Disp: , Rfl:  .  hydrochlorothiazide (HYDRODIURIL) 25 MG tablet, TAKE 1 TABLET EVERY DAY, Disp: 90 tablet, Rfl: 3 .  lansoprazole (PREVACID) 30 MG capsule, TAKE 1 CAPSULE EVERY DAY, Disp: 90 capsule, Rfl: 3 .  LIDODERM 5 %, APPLY 1  PATCH FOR 12 HOURS IN A 24 HOUR PERIOD as needed, Disp: , Rfl: 3 .  metoprolol succinate (TOPROL-XL) 100 MG 24 hr tablet, TAKE 1 AND 1/2 TABLETS EVERY DAY, Disp: 135 tablet, Rfl: 4 .  MULTIPLE VITAMIN PO, daily. , Disp: , Rfl:  .  naproxen sodium (ALEVE) 220 MG tablet, Take 220 mg by mouth as needed., Disp: , Rfl:  .  nortriptyline (PAMELOR) 50 MG capsule, TAKE 2 CAPSULES AT BEDTIME, Disp: 180 capsule, Rfl: 4 .  simvastatin (ZOCOR) 40 MG tablet, TAKE 1 TABLET EVERY DAY, Disp: 90 tablet, Rfl: 3 .  triamcinolone (NASACORT AQ) 55 MCG/ACT AERO nasal inhaler, Place 1 spray into the nose daily. , Disp: , Rfl:  .  zolpidem (AMBIEN CR) 12.5 MG CR tablet, Take 1 tablet (12.5 mg total) by mouth at bedtime as needed for sleep., Disp: 30 tablet, Rfl: 5  Patient Care Team: Malva Limes, MD as PCP - General (Family Medicine) Wilhemina Bonito, MD (Physical Medicine and Rehabilitation) Candice Camp, MD as Consulting Physician (Ophthalmology)     Objective:   Vitals: BP (!) 146/78  Vitals:   BP 142/70 (BP Location: Right Arm)    Pulse 91    Temp 98.7 F (37.1 C) (Oral)    Ht  (1.626 m)    Wt 167 lb 12.8 oz (76.1 kg)    BMI 28.80 kg/m    BSA 1.85 m        More Vitals          Physical Exam  General Appearance:    Alert, cooperative, no distress, appears stated age  Head:    Normocephalic, without obvious abnormality, atraumatic  Eyes:    PERRL, conjunctiva/corneas clear, EOM's intact, fundi    benign, both eyes  Ears:    Normal TM's and external ear canals, both ears  Nose:   Nares normal, septum midline, mucosa normal, no drainage    or sinus tenderness  Throat:   Lips, mucosa, and tongue normal; teeth and gums normal  Neck:   Supple, symmetrical, trachea midline, subtle nodularity and swelling of right lateral aspect of neck; no carotid   bruit or JVD  Back:     Symmetric, no curvature, ROM normal, no CVA tenderness  Lungs:     Clear to auscultation bilaterally,  respirations unlabored  Chest Wall:    No tenderness or deformity   Heart:    Regular rate and rhythm, S1 and S2 normal, no murmur, rub   or gallop  Breast Exam:    deferred  Abdomen:     Soft, non-tender, bowel sounds active all four quadrants,    no masses, no organomegaly  Pelvic:  deferred  Extremities:   Extremities normal, atraumatic, no cyanosis or edema  Pulses:   2+ and symmetric all extremities  Skin:   Skin color, texture, turgor normal, no rashes or lesions  Lymph nodes:   Cervical, supraclavicular, and axillary nodes normal  Neurologic:   CNII-XII intact, normal strength, sensation and reflexes    throughout     Activities of Daily Living In your present state of health, do you have any difficulty performing the following activities: 07/27/2017  Hearing? Y  Comment Due to wax- was recently removed. Has trouble hearing with background noise- has seen an audiologist for this.   Vision? N  Difficulty concentrating or making decisions? N  Walking or climbing stairs? N  Dressing or bathing? N  Doing errands, shopping? N  Preparing Food and eating ? N  Using the Toilet? N  In the past six months, have you accidently leaked urine? N  Do you have problems with loss of bowel control? N  Managing your Medications? N  Managing your Finances? N  Housekeeping or managing your Housekeeping? N  Some recent data might be hidden    Fall Risk Assessment Fall Risk  07/27/2017 07/21/2016 07/23/2015  Falls in the past year? No No Yes  Number falls in past yr: - - 1  Injury with Fall? - - Yes  Comment - - passed out and fell in May 2016, broke pelvis bane     Depression Screen PHQ 2/9 Scores 07/27/2017 07/27/2017 07/21/2016 07/23/2015  PHQ - 2 Score 0 0 0 0  PHQ- 9 Score 0 - - 2     Assessment & Plan:    Annual Physical Reviewed patient's Family Medical History Reviewed and updated list of patient's medical providers Assessment of cognitive impairment was done Assessed  patient's functional ability Established a written schedule for health screening services Health Risk Assessent Completed and Reviewed  Exercise Activities and Dietary recommendations Goals    . DIET - INCREASE WATER INTAKE     Recommend increasing water intake to 406 glasses a day.        Immunization History  Administered Date(s) Administered  . Influenza, High Dose Seasonal PF 11/21/2014, 11/21/2015, 12/10/2016  . Influenza-Unspecified 10/30/2013  . Pneumococcal Conjugate-13 12/14/2013  . Pneumococcal Polysaccharide-23 12/28/2003  . Td 08/30/2000    Health Maintenance  Topic Date Due  . TETANUS/TDAP  03/01/2026 (Originally 08/31/2010)  . INFLUENZA VACCINE  09/29/2017  . DEXA SCAN  Completed  . PNA vac Low Risk Adult  Completed     Discussed health benefits of physical activity, and encouraged her to engage in regular exercise appropriate for her age and condition.    ------------------------------------------------------------------------------------------------------------  1. Annual physical exam   2. Osteoporosis without current pathological fracture, unspecified osteoporosis type On bisphosphonate holiday since 2017. Is agreeable to restarting if any worsening of BMD - DG Bone Density; Future - VITAMIN D 25 Hydroxy (Vit-D Deficiency, Fractures)  3. Hyperlipidemia, unspecified hyperlipidemia type She is tolerating simvastatin well with no adverse effects.   - Lipid panel - Comprehensive metabolic panel  4. Essential (primary) hypertension Doing well current medications. Usually better controlled than today.   5. Localized swelling, mass and lump, neck  - US Soft Tissue Head/Neck; Future  6. Chronic constipation Intolerant to month long trial of preferred medication, Linzess. Previous did very well with Amitiza. Will contact [employer] to initiation new prior authorization for Amitiza.   Mila Merry, MD  [facility]

## 2017-07-27 NOTE — Patient Instructions (Signed)
[name] , Thank you for taking time to come for your Medicare Wellness Visit. I appreciate your ongoing commitment to your health goals. Please review the following plan we discussed and let me know if I can assist you in the future.   Screening recommendations/referrals: Colonoscopy: Up to date Mammogram: Up to date Bone Density: Up to date Recommended yearly ophthalmology/optometry visit for glaucoma screening and checkup Recommended yearly dental visit for hygiene and checkup  Vaccinations: Influenza vaccine: Up to date Pneumococcal vaccine: Up to date Tdap vaccine: Pt declines today.  Shingles vaccine: Pt declines today.     Advanced directives: Please bring a copy of your POA (Power of Attorney) and/or Living Will to your next appointment.   Conditions/risks identified: Recommend increasing water intake to 406 glasses a day.   Next appointment: 3:00 PM today with Dr Sherrie Mustache.    Preventive Care 51 Years and Older, Female Preventive care refers to lifestyle choices and visits with your health care provider that can promote health and wellness. What does preventive care include?  A yearly physical exam. This is also called an annual well check.  Dental exams once or twice a year.  Routine eye exams. Ask your health care provider how often you should have your eyes checked.  Personal lifestyle choices, including:  Daily care of your teeth and gums.  Regular physical activity.  Eating a healthy diet.  Avoiding tobacco and drug use.  Limiting alcohol use.  Practicing safe sex.  Taking low-dose aspirin every day.  Taking vitamin and mineral supplements as recommended by your health care provider. What happens during an annual well check? The services and screenings done by your health care provider during your annual well check will depend on your age, overall health, lifestyle risk factors, and family history of disease. Counseling  Your health care provider may  ask you questions about your:  Alcohol use.  Tobacco use.  Drug use.  Emotional well-being.  Home and relationship well-being.  Sexual activity.  Eating habits.  History of falls.  Memory and ability to understand (cognition).  Work and work Astronomer.  Reproductive health. Screening  You may have the following tests or measurements:  Height, weight, and BMI.  Blood pressure.  Lipid and cholesterol levels. These may be checked every 5 years, or more frequently if you are over 42 years old.  Skin check.  Lung cancer screening. You may have this screening every year starting at age 36 if you have a 30-pack-year history of smoking and currently smoke or have quit within the past 15 years.  Fecal occult blood test (FOBT) of the stool. You may have this test every year starting at age 52.  Flexible sigmoidoscopy or colonoscopy. You may have a sigmoidoscopy every 5 years or a colonoscopy every 10 years starting at age 72.  Hepatitis C blood test.  Hepatitis B blood test.  Sexually transmitted disease (STD) testing.  Diabetes screening. This is done by checking your blood sugar (glucose) after you have not eaten for a while (fasting). You may have this done every 1-3 years.  Bone density scan. This is done to screen for osteoporosis. You may have this done starting at age 53.  Mammogram. This may be done every 1-2 years. Talk to your health care provider about how often you should have regular mammograms. Talk with your health care provider about your test results, treatment options, and if necessary, the need for more tests. Vaccines  Your health care provider may  recommend certain vaccines, such as:  Influenza vaccine. This is recommended every year.  Tetanus, diphtheria, and acellular pertussis (Tdap, Td) vaccine. You may need a Td booster every 10 years.  Zoster vaccine. You may need this after age 74.  Pneumococcal 13-valent conjugate (PCV13) vaccine. One  dose is recommended after age 43.  Pneumococcal polysaccharide (PPSV23) vaccine. One dose is recommended after age 39. Talk to your health care provider about which screenings and vaccines you need and how often you need them. This information is not intended to replace advice given to you by your health care provider. Make sure you discuss any questions you have with your health care provider. Document Released: 03/14/2015 Document Revised: 11/05/2015 Document Reviewed: 12/17/2014 Elsevier Interactive Patient Education  2017 [place] Prevention in the Home Falls can cause injuries. They can happen to people of all ages. There are many things you can do to make your home safe and to help prevent falls. What can I do on the outside of my home?  Regularly fix the edges of walkways and driveways and fix any cracks.  Remove anything that might make you trip as you walk through a door, such as a raised step or threshold.  Trim any bushes or trees on the path to your home.  Use bright outdoor lighting.  Clear any walking paths of anything that might make someone trip, such as rocks or tools.  Regularly check to see if handrails are loose or broken. Make sure that both sides of any steps have handrails.  Any raised decks and porches should have guardrails on the edges.  Have any leaves, snow, or ice cleared regularly.  Use sand or salt on walking paths during winter.  Clean up any spills in your garage right away. This includes oil or grease spills. What can I do in the bathroom?  Use night lights.  Install grab bars by the toilet and in the tub and shower. Do not use towel bars as grab bars.  Use non-skid mats or decals in the tub or shower.  If you need to sit down in the shower, use a plastic, non-slip stool.  Keep the floor dry. Clean up any water that spills on the floor as soon as it happens.  Remove soap buildup in the tub or shower regularly.  Attach bath  mats securely with double-sided non-slip rug tape.  Do not have throw rugs and other things on the floor that can make you trip. What can I do in the bedroom?  Use night lights.  Make sure that you have a light by your bed that is easy to reach.  Do not use any sheets or blankets that are too big for your bed. They should not hang down onto the floor.  Have a firm chair that has side arms. You can use this for support while you get dressed.  Do not have throw rugs and other things on the floor that can make you trip. What can I do in the kitchen?  Clean up any spills right away.  Avoid walking on wet floors.  Keep items that you use a lot in easy-to-reach places.  If you need to reach something above you, use a strong step stool that has a grab bar.  Keep electrical cords out of the way.  Do not use floor polish or wax that makes floors slippery. If you must use wax, use non-skid floor wax.  Do not have throw rugs and  other things on the floor that can make you trip. What can I do with my stairs?  Do not leave any items on the stairs.  Make sure that there are handrails on both sides of the stairs and use them. Fix handrails that are broken or loose. Make sure that handrails are as long as the stairways.  Check any carpeting to make sure that it is firmly attached to the stairs. Fix any carpet that is loose or worn.  Avoid having throw rugs at the top or bottom of the stairs. If you do have throw rugs, attach them to the floor with carpet tape.  Make sure that you have a light switch at the top of the stairs and the bottom of the stairs. If you do not have them, ask someone to add them for you. What else can I do to help prevent falls?  Wear shoes that:  Do not have high heels.  Have rubber bottoms.  Are comfortable and fit you well.  Are closed at the toe. Do not wear sandals.  If you use a stepladder:  Make sure that it is fully opened. Do not climb a closed  stepladder.  Make sure that both sides of the stepladder are locked into place.  Ask someone to hold it for you, if possible.  Clearly mark and make sure that you can see:  Any grab bars or handrails.  First and last steps.  Where the edge of each step is.  Use tools that help you move around (mobility aids) if they are needed. These include:  Canes.  Walkers.  Scooters.  Crutches.  Turn on the lights when you go into a dark area. Replace any light bulbs as soon as they burn out.  Set up your furniture so you have a clear path. Avoid moving your furniture around.  If any of your floors are uneven, fix them.  If there are any pets around you, be aware of where they are.  Review your medicines with your doctor. Some medicines can make you feel dizzy. This can increase your chance of falling. Ask your doctor what other things that you can do to help prevent falls. This information is not intended to replace advice given to you by your health care provider. Make sure you discuss any questions you have with your health care provider. Document Released: 12/12/2008 Document Revised: 07/24/2015 Document Reviewed: 03/22/2014 Elsevier Interactive Patient Education  2017 [employer].

## 2017-07-27 NOTE — Patient Instructions (Addendum)
   The CDC recommends two doses of Shingrix (the shingles vaccine) separated by 2 to 6 months for adults age 79 years and older. I recommend checking with your insurance plan regarding coverage for this vaccine.   

## 2017-07-27 NOTE — Progress Notes (Addendum)
Subjective:   Erica Smith is a 77 y.o. female who presents for Medicare Annual (Subsequent) preventive examination.  Review of Systems:  N/A  Cardiac Risk Factors include: advanced age (>15men, >64 women);dyslipidemia;hypertension     Objective:     Vitals: BP (!) 142/70 (BP Location: Right Arm)   Pulse 91   Temp 98.7 F (37.1 C) (Oral)   Ht  (1.626 m)   Wt 167 lb 12.8 oz (76.1 kg)   BMI 28.80 kg/m   Body mass index is 28.8 kg/m.  Advanced Directives 07/27/2017 07/21/2016 12/04/2015 12/18/2014  Does Patient Have a Medical Advance Directive? Yes Yes Yes Yes  Type of Advance Directive Living will Living will Living will Healthcare Power of [place];Living will  Does patient want to make changes to medical advance directive? - - - No - Patient declined  Copy of Healthcare Power of Attorney in Chart? - - - No - copy requested    Tobacco Social History   Tobacco Use  Smoking Status Never Smoker  Smokeless Tobacco Never Used     Counseling given: Not Answered   Clinical Intake:  Pre-visit preparation completed: Yes  Pain : No/denies pain Pain Score: 0-No pain     Nutritional Status: BMI 25 -29 Overweight Nutritional Risks: None Diabetes: No  How often do you need to have someone help you when you read instructions, pamphlets, or other written materials from your doctor or pharmacy?: 1 - Never  Interpreter Needed?: No  Information entered by :: [facility], LPN  Past Medical History:  Diagnosis Date  . Bursitis of left hip   . Depression   . GERD (gastroesophageal reflux disease)   . Hypercholesteremia   . Hypertension   . Osteoporosis   . Pleural effusion 2008   after hip surgery  . Seasonal allergies   . Swelling of left lower extremity   . Wears dentures    partial upper   Past Surgical History:  Procedure Laterality Date  . BREAST BIOPSY Bilateral    neg  . BREAST SURGERY Bilateral    cyst removal  . CATARACT EXTRACTION Left   .  CATARACT EXTRACTION W/PHACO Right 12/18/2014   Procedure: CATARACT EXTRACTION PHACO AND INTRAOCULAR LENS PLACEMENT (IOC);  Surgeon: Lockie Mola, MD;  Location[facility];  Service: Ophthalmology;  Laterality: Right;  . JOINT REPLACEMENT Left 2008   hip  . OVARIAN CYST REMOVAL    . TONSILLECTOMY    . TOTAL HIP REVISION Left 2008   Family History  Problem Relation Age of Onset  . Heart disease Mother   . Diabetes Mother   . Hypertension Mother   . Stroke Mother   . Hypertension Father   . Heart attack Father   . Cancer Brother        throat and liver  . Hypertension Sister   . Breast cancer Neg Hx    Social History   Socioeconomic History  . Marital status: Married    Spouse name: Not on file  . Number of children: 1  . Years of education: Not on file  . Highest education level: 12th grade  Occupational History  . Occupation: retired  Engineer, production  . Financial resource strain: Not hard at all  . Food insecurity:    Worry: Never true    Inability: Never true  . Transportation needs:    Medical: No    Non-medical: No  Tobacco Use  . Smoking status: Never Smoker  . Smokeless  tobacco: Never Used  Substance and Sexual Activity  . Alcohol use: No  . Drug use: No  . Sexual activity: Not on file  Lifestyle  . Physical activity:    Days per week: Not on file    Minutes per session: Not on file  . Stress: Only a little  Relationships  . Social connections:    Talks on phone: Not on file    Gets together: Not on file    Attends religious service: Not on file    Active member of club or organization: Not on file    Attends meetings of clubs or organizations: Not on file    Relationship status: Not on file  Other Topics Concern  . Not on file  Social History Narrative  . Not on file    Outpatient Encounter Medications as of 07/27/2017  Medication Sig  . aspirin 81 MG tablet   . Calcium Carb-Cholecalciferol (CALTRATE 600+D) 600-800 MG-UNIT TABS  Take 2 capsules by mouth daily.   . Cholecalciferol (VITAMIN D3) 1000 units CAPS Take 1 capsule by mouth 2 (two) times daily.  . Glucosamine-Chondroit-Vit C-Mn (GLUCOSAMINE CHONDR 1500 COMPLX) CAPS Take 1,500 capsules by mouth daily.   . hydrochlorothiazide (HYDRODIURIL) 25 MG tablet TAKE 1 TABLET EVERY DAY  . lansoprazole (PREVACID) 30 MG capsule TAKE 1 CAPSULE EVERY DAY  . LIDODERM 5 % APPLY 1 PATCH FOR 12 HOURS IN A 24 HOUR PERIOD as needed  . metoprolol succinate (TOPROL-XL) 100 MG 24 hr tablet TAKE 1 AND 1/2 TABLETS EVERY DAY  . MULTIPLE VITAMIN PO daily.   . naproxen sodium (ALEVE) 220 MG tablet Take 220 mg by mouth as needed.  . nortriptyline (PAMELOR) 50 MG capsule TAKE 2 CAPSULES AT BEDTIME  . simvastatin (ZOCOR) 40 MG tablet TAKE 1 TABLET EVERY DAY  . triamcinolone (NASACORT AQ) 55 MCG/ACT AERO nasal inhaler Place 1 spray into the nose daily.   Marland Kitchen zolpidem (AMBIEN CR) 12.5 MG CR tablet Take 1 tablet (12.5 mg total) by mouth at bedtime as needed for sleep.  Marland Kitchen linaclotide (LINZESS) 145 MCG CAPS capsule Take 1 capsule (145 mcg total) by mouth daily before breakfast. (Patient not taking: Reported on 07/27/2017)  . lubiprostone (AMITIZA) 8 MCG capsule Take 1 capsule (8 mcg total) by mouth daily. (Patient not taking: Reported on 07/27/2017)  . Suvorexant (BELSOMRA) 10 MG TABS Take 1 tablet by mouth at bedtime. (Patient not taking: Reported on 07/27/2017)   No facility-administered encounter medications on file as of 07/27/2017.     Activities of Daily Living In your present state of health, do you have any difficulty performing the following activities: 07/27/2017  Hearing? Y  Comment Due to wax- was recently removed. Has trouble hearing with background noise- has seen an audiologist for this.   Vision? N  Difficulty concentrating or making decisions? N  Walking or climbing stairs? N  Dressing or bathing? N  Doing errands, shopping? N  Preparing Food and eating ? N  Using the Toilet? N    In the past six months, have you accidently leaked urine? N  Do you have problems with loss of bowel control? N  Managing your Medications? N  Managing your Finances? N  Housekeeping or managing your Housekeeping? N  Some recent data might be hidden    Patient Care Team: Malva Limes, MD as PCP - General (Family Medicine) Wilhemina Bonito, MD (Physical Medicine and Rehabilitation) Candice Camp, MD as Consulting Physician (Ophthalmology)    Assessment:  This is a routine wellness examination for [place].  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 )  Goals    . DIET - INCREASE WATER INTAKE     Recommend increasing water intake to 406 glasses a day.        Fall Risk Fall Risk  07/27/2017 07/21/2016 07/23/2015  Falls in the past year? No No Yes  Number falls in past yr: - - 1  Injury with Fall? - - Yes  Comment - - passed out and fell in May 2016, broke pelvis bane   Is the patient's home free of loose throw rugs in walkways, pet beds, electrical cords, etc?   yes      Grab bars in the bathroom? yes      Handrails on the stairs?   yes      Adequate lighting?   yes  Timed Get Up and Go performed: N/A  Depression Screen PHQ 2/9 Scores 07/27/2017 07/27/2017 07/21/2016 07/23/2015  PHQ - 2 Score 0 0 0 0  PHQ- 9 Score 0 - - 2     Cognitive Function     6CIT Screen 07/27/2017 07/21/2016  What Year? 0 points 0 points  What month? 0 points 0 points  What time? 0 points 0 points  Count back from 20 0 points 0 points  Months in reverse 0 points 0 points  Repeat phrase 2 points 4 points  Total Score 2 4    Immunization History  Administered Date(s) Administered  . Influenza, High Dose Seasonal PF 11/21/2014, 11/21/2015, 12/10/2016  . Influenza-Unspecified 10/30/2013  . Pneumococcal Conjugate-13  12/14/2013  . Pneumococcal Polysaccharide-23 12/28/2003  . Td 08/30/2000    Qualifies for Shingles Vaccine? Due for Shingles vaccine. Declined my offer to administer today. Education has been provided regarding the importance of this vaccine. Pt has been advised to call her insurance company to determine her out of pocket expense. Advised she may also receive this vaccine at her local pharmacy or Health Dept. Verbalized acceptance and understanding.  Screening Tests Health Maintenance  Topic Date Due  . TETANUS/TDAP  03/01/2026 (Originally 08/31/2010)  . INFLUENZA VACCINE  09/29/2017  . DEXA SCAN  Completed  . PNA vac Low Risk Adult  Completed    Cancer Screenings: Lung: Low Dose CT Chest recommended if Age 55-80 years, 30 pack-year currently smoking OR have quit w/in 15years. Patient does not qualify. Breast:  Up to date on Mammogram? Yes   Up to date of Bone Density/Dexa? Yes Colorectal: Up to date  Additional Screenings:  Hepatitis C Screening: N/A     Plan:  I have personally reviewed and addressed the Medicare Annual Wellness questionnaire and have noted the following in the patient's chart:  A. Medical and social history B. Use of alcohol, tobacco or illicit drugs  C. Current medications and supplements D. Functional ability and status E.  Nutritional status F.  Physical activity G. Advance directives H. List of other physicians I.  Hospitalizations, surgeries, and ER visits in previous 12 months J.  Vitals K. Screenings such as hearing and vision if needed, cognitive and depression L. Referrals and appointments - none  In addition, I have reviewed and discussed with patient certain preventive protocols, quality metrics, and best practice recommendations. A written personalized care plan for preventive services as well as general preventive health recommendations were provided to  patient.  See attached scanned questionnaire for additional information.   Signed,    Hyacinth Meeker, LPN Nurse Health Advisor   Nurse Recommendations: Pt declined the tetanus vaccine today.

## 2017-08-01 ENCOUNTER — Ambulatory Visit: Payer: Medicare PPO

## 2017-08-01 ENCOUNTER — Other Ambulatory Visit: Payer: Self-pay | Admitting: Family Medicine

## 2017-08-01 MED ORDER — ZOLPIDEM TARTRATE ER 12.5 MG PO TBCR: 12.5000 mg | EXTENDED_RELEASE_TABLET | Freq: Every evening | ORAL | 5 refills | Status: DC | PRN

## 2017-08-01 NOTE — Telephone Encounter (Signed)
Pt needs refill on her generic ambein 12.5 mg.  3 refills  [facility] [place]  thanks teri

## 2017-08-02 ENCOUNTER — Telehealth: Payer: Self-pay | Admitting: Family Medicine

## 2017-08-02 NOTE — Telephone Encounter (Signed)
Pt wanted to let Dr. Sher[facility]Fisher [facility]  [employer] needs a rx for her sent to them for the Marylene LandAmitiza  Teri

## 2017-08-02 NOTE — Telephone Encounter (Signed)
[facility] that was sent [facility]23/2017. [employer] also stated that pt's co-pay will be $292. Patient was advised. Patient stated she is going to contact her insurance company to discuss medication cost and then she will get back in touch with us to let us know what she decides to do.

## 2017-08-03 ENCOUNTER — Telehealth: Payer: Self-pay

## 2017-08-03 ENCOUNTER — Ambulatory Visit
Admission: RE | Admit: 2017-08-03 | Discharge: 2017-08-03 | Disposition: A | Discharge status: Home / Self Care | Payer: Medicare PPO | Source: Ambulatory Visit | Attending: Family Medicine | Admitting: Family Medicine

## 2017-08-03 DIAGNOSIS — M810 Age-related osteoporosis without current pathological fracture: Secondary | ICD-10-CM | POA: Diagnosis not present

## 2017-08-03 DIAGNOSIS — E785 Hyperlipidemia, unspecified: Secondary | ICD-10-CM | POA: Diagnosis not present

## 2017-08-03 DIAGNOSIS — R221 Localized swelling, mass and lump, neck: Secondary | ICD-10-CM | POA: Insufficient documentation

## 2017-08-03 NOTE — Telephone Encounter (Signed)
-----   Message from Malva Limesonald E Fisher, MD sent at 08/03/2017  1:55 PM EDT ----- Ultrasound shows several benign lymph nodes in neck. No treatment or follow up needed.

## 2017-08-03 NOTE — Telephone Encounter (Signed)
[facility] with a recorded message advising that the Rx for lubiprostone (AMITIZA) 8 MCG capsule has been canceled. Please advise. Thanks TNP

## 2017-08-03 NOTE — Telephone Encounter (Signed)
Pt advised.   Thanks,   -Erica Smith  

## 2017-08-04 LAB — COMPREHENSIVE METABOLIC PANEL
A/G RATIO: 1.6 (ref 1.2–2.2)
ALT: 29 IU/L (ref 0–32)
AST: 30 IU/L (ref 0–40)
Albumin: 4.4 g/dL (ref 3.5–4.8)
Alkaline Phosphatase: 75 IU/L (ref 39–117)
BILIRUBIN TOTAL: 0.3 mg/dL (ref 0.0–1.2)
BUN/Creatinine Ratio: 23 (ref 12–28)
BUN: 18 mg/dL (ref 8–27)
CHLORIDE: 99 mmol/L (ref 96–106)
CO2: 27 mmol/L (ref 20–29)
Calcium: 10.7 mg/dL — ABNORMAL HIGH (ref 8.7–10.3)
Creatinine, Ser: 0.77 mg/dL (ref 0.57–1.00)
GFR, EST AFRICAN AMERICAN: 86 mL/min/{1.73_m2} (ref >59)
GFR, EST NON AFRICAN AMERICAN: 74 mL/min/{1.73_m2} (ref >59)
GLOBULIN, TOTAL: 2.7 g/dL (ref 1.5–4.5)
Glucose: 95 mg/dL (ref 65–99)
POTASSIUM: 4.4 mmol/L (ref 3.5–5.2)
SODIUM: 139 mmol/L (ref 134–144)
TOTAL PROTEIN: 7.1 g/dL (ref 6.0–8.5)

## 2017-08-04 LAB — LIPID PANEL
CHOL/HDL RATIO: 3.7 ratio (ref 0.0–4.4)
CHOLESTEROL TOTAL: 163 mg/dL (ref 100–199)
HDL: 44 mg/dL (ref >39)
LDL CALC: 86 mg/dL (ref 0–99)
TRIGLYCERIDES: 167 mg/dL — AB (ref 0–149)
VLDL Cholesterol Cal: 33 mg/dL (ref 5–40)

## 2017-08-04 LAB — VITAMIN D 25 HYDROXY (VIT D DEFICIENCY, FRACTURES): Vit D, 25-Hydroxy: 67.6 ng/mL (ref 30.0–100.0)

## 2017-08-09 ENCOUNTER — Ambulatory Visit
Admission: RE | Admit: 2017-08-09 | Discharge: 2017-08-09 | Disposition: A | Discharge status: Home / Self Care | Payer: Medicare PPO | Source: Ambulatory Visit | Attending: Family Medicine | Admitting: Family Medicine

## 2017-08-09 ENCOUNTER — Other Ambulatory Visit: Payer: Self-pay | Admitting: Family Medicine

## 2017-08-09 DIAGNOSIS — M810 Age-related osteoporosis without current pathological fracture: Secondary | ICD-10-CM | POA: Diagnosis not present

## 2017-08-09 DIAGNOSIS — Z780 Asymptomatic menopausal state: Secondary | ICD-10-CM | POA: Diagnosis not present

## 2017-08-09 DIAGNOSIS — M85851 Other specified disorders of bone density and structure, right thigh: Secondary | ICD-10-CM | POA: Diagnosis not present

## 2017-08-09 MED ORDER — IBANDRONATE SODIUM 150 MG PO TABS: 150.0000 mg | ORAL_TABLET | ORAL | 4 refills | Status: DC

## 2017-08-11 NOTE — Telephone Encounter (Signed)
Patient stated that she has decided not to take Amitiza. She stated she will go back on the medication once the generic form is out on the market. Until then she just won't take anything.

## 2018-01-06 ENCOUNTER — Other Ambulatory Visit: Payer: Self-pay | Admitting: Family Medicine

## 2018-01-06 MED ORDER — IBANDRONATE SODIUM 150 MG PO TABS: 150.0000 mg | ORAL_TABLET | ORAL | 4 refills | Status: DC

## 2018-01-06 NOTE — Telephone Encounter (Signed)
Pt needing refill on:  ibandronate (BONIVA) 150 MG tablet  Please fill at:  [facility] - [place], [place] - [address] [phone] (Phone) [phone] (Fax)    Thanks, [facility]

## 2018-01-15 DIAGNOSIS — B9689 Other specified bacterial agents as the cause of diseases classified elsewhere: Secondary | ICD-10-CM | POA: Diagnosis not present

## 2018-01-15 DIAGNOSIS — J019 Acute sinusitis, unspecified: Secondary | ICD-10-CM | POA: Diagnosis not present

## 2018-01-30 ENCOUNTER — Other Ambulatory Visit: Payer: Self-pay | Admitting: Family Medicine

## 2018-01-30 MED ORDER — ZOLPIDEM TARTRATE ER 12.5 MG PO TBCR: 12.5000 mg | EXTENDED_RELEASE_TABLET | Freq: Every evening | ORAL | 5 refills | Status: DC | PRN

## 2018-01-30 NOTE — Telephone Encounter (Signed)
Pt needing a refill on: zolpidem (AMBIEN CR) 12.5 MG CR tablet  Please fill at: [id number] [place], [place] - [address] [phone] (Phone) [phone] (Fax)    Thanks, [facility]

## 2018-02-04 DIAGNOSIS — M810 Age-related osteoporosis without current pathological fracture: Secondary | ICD-10-CM | POA: Diagnosis not present

## 2018-02-04 DIAGNOSIS — Z882 Allergy status to sulfonamides status: Secondary | ICD-10-CM | POA: Diagnosis not present

## 2018-02-04 DIAGNOSIS — B029 Zoster without complications: Secondary | ICD-10-CM | POA: Diagnosis not present

## 2018-02-04 DIAGNOSIS — R42 Dizziness and giddiness: Secondary | ICD-10-CM | POA: Diagnosis not present

## 2018-02-04 DIAGNOSIS — E7800 Pure hypercholesterolemia, unspecified: Secondary | ICD-10-CM | POA: Diagnosis not present

## 2018-02-04 DIAGNOSIS — Z886 Allergy status to analgesic agent status: Secondary | ICD-10-CM | POA: Diagnosis not present

## 2018-02-04 DIAGNOSIS — Z79899 Other long term (current) drug therapy: Secondary | ICD-10-CM | POA: Diagnosis not present

## 2018-02-04 DIAGNOSIS — K5900 Constipation, unspecified: Secondary | ICD-10-CM | POA: Diagnosis not present

## 2018-02-04 DIAGNOSIS — I10 Essential (primary) hypertension: Secondary | ICD-10-CM | POA: Diagnosis not present

## 2018-02-06 ENCOUNTER — Telehealth: Payer: Self-pay

## 2018-02-06 NOTE — Telephone Encounter (Signed)
Patients daughter had called the office wanting to schedule patient a hospital follow up visit. Patients daughter states that [facility], she wanted to schedule appt for Wednesday afternoon because she lives in [place] and states that she has a hard time making appts because of distance. Dr. Sherrie MustacheFisher does not have another opening for a 40 min slot until Friday morning at 9AM. Patients daughter states that she could not do that and wanted patient to be seen Wednesday, there is a 3:40pm same day appt slot available that day is there anyway patient scan be squeezed in during that time? Please give patients daughter a call back on cellphone to advise. KW

## 2018-02-06 NOTE — Telephone Encounter (Signed)
Yes, she can have the 3:40 for 40 minutes.

## 2018-02-06 NOTE — Telephone Encounter (Signed)
Apt made for 02/08/2018 at 3:40.  Kelly advised.   Thanks,   -Vernona RiegerLaura

## 2018-02-08 ENCOUNTER — Ambulatory Visit: Payer: Medicare PPO | Admitting: Family Medicine

## 2018-02-08 ENCOUNTER — Encounter: Payer: Self-pay | Admitting: Family Medicine

## 2018-02-08 VITALS — BP 128/68 | HR 84 | Temp 97.8°F | Resp 16 | Wt 160.0 lb

## 2018-02-08 DIAGNOSIS — K5909 Other constipation: Secondary | ICD-10-CM | POA: Diagnosis not present

## 2018-02-08 DIAGNOSIS — E871 Hypo-osmolality and hyponatremia: Secondary | ICD-10-CM | POA: Diagnosis not present

## 2018-02-08 DIAGNOSIS — B029 Zoster without complications: Secondary | ICD-10-CM | POA: Diagnosis not present

## 2018-02-08 MED ORDER — LUBIPROSTONE 8 MCG PO CAPS: 8.0000 ug | ORAL_CAPSULE | Freq: Every day | ORAL | 3 refills | Status: AC

## 2018-02-08 NOTE — Progress Notes (Signed)
Patient: Erica ClicheFrances E Smith Female    DOB: 1941/02/25   77 y.o.   MRN: [id number] Visit Date: 02/08/2018  Today's Provider: Mila Merryonald Masoud Nyce, MD   Chief Complaint  Patient presents with  . Follow-up    ER Follow up  . Constipation  . Abnormal Lab    Elevated Calcium  . Herpes Zoster    First noticed about a week ago.   Subjective:      Follow up ER visit  Patient was seen in ER for Constipation on 02/04/2018. She was treated for Constipation confirmed on abdominal CT.  Pt's Calcium level was elevated at 12.7 and sodium was slightly low at 133.  Treatment for this included appropriate hydration, increase Miralax for the constipation.  Pt started Valtrex for shingles.  At time of ER visit she was not having any pain with shingles, but started hurting day she started valtrex.  Advised to stop Caltrate and have her calcium parathyroid checked. She reports excellent compliance with treatment. She reports this condition is Improved.  ------------------------------------------------------------------------------------     Constipation  This is a new problem. The current episode started in the past 7 days. The problem has been resolved since onset. Associated symptoms include abdominal pain (Pt reports having some right sided abdominal pain.  She states it has improved some.  ). Pertinent negatives include no bloating, diarrhea, fecal incontinence, hemorrhoids, nausea, rectal pain, vomiting or weight loss. She has tried diet changes, laxatives and enemas for the symptoms. The treatment provided significant relief.  she was previously on Amitiza which worked very well, but she stopped several months ago due to cost of medication. Had changed to daily miralax, but apparently was not taking consistently prior to recent ER visit. Briefly increase 2 dose a day after visit, and now back to one and is having normal bowel movements.      Allergies  Allergen Reactions  . Alendronate  Sodium     Pt denies   . Fish Oil     upset stomach, pt denies   . Fluticasone Propionate     Other reaction(s): Dizzyness, pt denies   . Linzess [Linaclotide]     Dizziness, abdominal pain  . Lovastatin     Pt denies   . Bextra [Valdecoxib] Rash  . Ceftin [Cefuroxime Axetil] Rash  . Celebrex [Celecoxib] Rash  . Ciprofloxacin Rash  . Phazyme [Simethicone] Rash  . Sulfamethoxazole-Trimethoprim Rash          Current Outpatient Medications:  .  aspirin 81 MG tablet, , Disp: , Rfl:  .  Cholecalciferol (VITAMIN D3) 1000 units CAPS, Take 1 capsule by mouth 2 (two) times daily., Disp: , Rfl:  .  Glucosamine-Chondroit-Vit C-Mn (GLUCOSAMINE CHONDR 1500 COMPLX) CAPS, Take 1,500 capsules by mouth daily. , Disp: , Rfl:  .  hydrochlorothiazide (HYDRODIURIL) 25 MG tablet, TAKE 1 TABLET EVERY DAY, Disp: 90 tablet, Rfl: 3 .  ibandronate (BONIVA) 150 MG tablet, Take 1 tablet (150 mg total) by mouth every 30 (thirty) days., Disp: 3 tablet, Rfl: 4 .  lansoprazole (PREVACID) 30 MG capsule, TAKE 1 CAPSULE EVERY DAY, Disp: 90 capsule, Rfl: 3 .  LIDODERM 5 %, APPLY 1 PATCH FOR 12 HOURS IN A 24 HOUR PERIOD as needed, Disp: , Rfl: 3 .  metoprolol succinate (TOPROL-XL) 100 MG 24 hr tablet, TAKE 1 AND 1/2 TABLETS EVERY DAY, Disp: 135 tablet, Rfl: 4 .  MULTIPLE VITAMIN PO, daily. , Disp: , Rfl:  .  nortriptyline (  PAMELOR) 50 MG capsule, TAKE 2 CAPSULES AT BEDTIME, Disp: 180 capsule, Rfl: 4 .  simvastatin (ZOCOR) 40 MG tablet, TAKE 1 TABLET EVERY DAY, Disp: 90 tablet, Rfl: 3 .  triamcinolone (NASACORT AQ) 55 MCG/ACT AERO nasal inhaler, Place 1 spray into the nose daily. , Disp: , Rfl:  .  valACYclovir (VALTREX) 1000 MG tablet, Take by mouth., Disp: , Rfl:  .  zolpidem (AMBIEN CR) 12.5 MG CR tablet, Take 1 tablet (12.5 mg total) by mouth at bedtime as needed for sleep., Disp: 30 tablet, Rfl: 5 .  Calcium Carb-Cholecalciferol (CALTRATE 600+D) 600-800 MG-UNIT TABS, Take 2 capsules by mouth daily. , Disp: ,  Rfl:  .  lubiprostone (AMITIZA) 8 MCG capsule, Take 1 capsule (8 mcg total) by mouth daily. (Patient not taking: Reported on 02/08/2018), Disp: 90 capsule, Rfl: 3 .  naproxen sodium (ALEVE) 220 MG tablet, Take 220 mg by mouth as needed., Disp: , Rfl:   Review of Systems  Constitutional: Negative.  Negative for weight loss.  Respiratory: Negative.   Cardiovascular: Negative.   Gastrointestinal: Positive for abdominal pain (Pt reports having some right sided abdominal pain.  She states it has improved some.  ) and constipation. Negative for abdominal distention, anal bleeding, bloating, blood in stool, diarrhea, hemorrhoids, nausea, rectal pain and vomiting.  Neurological: Positive for light-headedness. Negative for dizziness, tremors, seizures, syncope, facial asymmetry, speech difficulty, numbness and headaches.  Psychiatric/Behavioral: Positive for confusion (Pt daughter reports she has noticed some confusion recenlty.  She states it has improved some but still not back to baseline.).    Social History   Tobacco Use  . Smoking status: Never Smoker  . Smokeless tobacco: Never Used  Substance Use Topics  . Alcohol use: No   Objective:   BP 128/68 (BP Location: Right Arm, Patient Position: Sitting, Cuff Size: Normal)   Pulse 84   Temp 97.8 F (36.6 C) (Oral)   Resp 16   Wt 160 lb (72.6 kg)   BMI 27.46 kg/m  Vitals:   02/08/18 1614  BP: 128/68  Pulse: 84  Resp: 16  Temp: 97.8 F (36.6 C)  TempSrc: Oral  Weight: 160 lb (72.6 kg)     Physical Exam  General Appearance:    Alert, cooperative, no distress  Eyes:    PERRL, conjunctiva/corneas clear, EOM's intact       Lungs:     Clear to auscultation bilaterally, respirations unlabored  Heart:    Regular rate and rhythm  Abdomen:   Vesicular dermatomal eruption right upper abdomen, flank and back.        Assessment & Plan:     1. Chronic constipation She thinks amitiza may be improving coverage. Will send new  prescription to her pharmacy, if not then will try again after new year.   2. Hyponatremia Likely related to poor oral intake. Recheck electrolytes.  - Comprehensive metabolic panel - Parathyroid hormone, intact (no Ca)  3. Hypercalcemia Recently up to 12.7 from baseline of 10.7.   - Comprehensive metabolic panel - Parathyroid hormone, intact (no Ca)  4. Herpes zoster without complication Is to finish valacyclovir. Recommend Shingrix 2-3 months after recovering from current active shingles episode.        Mila Merry, MD  [facility]

## 2018-02-08 NOTE — Patient Instructions (Signed)
   The CDC recommends two doses of Shingrix (the shingles vaccine) separated by 2 to 6 months for adults age 79 years and older. I recommend checking with your pharmacy plan regarding coverage for this vaccine. You should wait at least 3 months after recovering from Shingles before getting the vaccine.

## 2018-02-09 ENCOUNTER — Telehealth: Payer: Self-pay

## 2018-02-09 LAB — COMPREHENSIVE METABOLIC PANEL
ALT: 20 IU/L (ref 0–32)
AST: 20 IU/L (ref 0–40)
Albumin/Globulin Ratio: 1.3 (ref 1.2–2.2)
Albumin: 3.9 g/dL (ref 3.5–4.8)
Alkaline Phosphatase: 69 IU/L (ref 39–117)
BILIRUBIN TOTAL: 0.2 mg/dL (ref 0.0–1.2)
BUN/Creatinine Ratio: 19 (ref 12–28)
BUN: 15 mg/dL (ref 8–27)
CALCIUM: 10.8 mg/dL — AB (ref 8.7–10.3)
CO2: 27 mmol/L (ref 20–29)
CREATININE: 0.78 mg/dL (ref 0.57–1.00)
Chloride: 98 mmol/L (ref 96–106)
GFR calc Af Amer: 84 mL/min/{1.73_m2} (ref >59)
GFR, EST NON AFRICAN AMERICAN: 73 mL/min/{1.73_m2} (ref >59)
Globulin, Total: 3.1 g/dL (ref 1.5–4.5)
Glucose: 98 mg/dL (ref 65–99)
Potassium: 3.6 mmol/L (ref 3.5–5.2)
SODIUM: 138 mmol/L (ref 134–144)
TOTAL PROTEIN: 7 g/dL (ref 6.0–8.5)

## 2018-02-09 LAB — PARATHYROID HORMONE, INTACT (NO CA): PTH: 39 pg/mL (ref 15–65)

## 2018-02-09 NOTE — Telephone Encounter (Signed)
Tried calling patient. A gentleman answered the phone stating that she was unavailable at this time. I left a message for her to call our office back.

## 2018-02-09 NOTE — Telephone Encounter (Signed)
Pt returned missed call.  Please call pt back. ° °Thanks, °TGH °

## 2018-02-09 NOTE — Telephone Encounter (Signed)
Pt informed of lab results.  dbs 

## 2018-02-09 NOTE — Telephone Encounter (Signed)
-----   Message from Malva Limesonald E Fisher, MD sent at 02/09/2018  8:06 AM EST ----- Calcium level is back down to 10.8, which is her baseline and normal considering she is taking hctz. Parathyroid hormone is normal.  Continue current medications.

## 2018-02-13 ENCOUNTER — Other Ambulatory Visit: Payer: Self-pay | Admitting: Family Medicine

## 2018-02-17 ENCOUNTER — Other Ambulatory Visit: Payer: Self-pay

## 2018-02-17 MED ORDER — HYDROCHLOROTHIAZIDE 25 MG PO TABS: 25.0000 mg | ORAL_TABLET | Freq: Every day | ORAL | 4 refills | Status: DC

## 2018-02-17 NOTE — Telephone Encounter (Signed)
Patient called requesting refills on medication. Mail order pharmacy. Thanks!

## 2018-05-11 ENCOUNTER — Other Ambulatory Visit: Payer: Self-pay | Admitting: Family Medicine

## 2018-06-28 ENCOUNTER — Telehealth: Payer: Self-pay

## 2018-06-28 NOTE — Telephone Encounter (Signed)
She can do a telephone or evisit for this.

## 2018-06-28 NOTE — Telephone Encounter (Signed)
Patient states she has sinus infection.  Green mucus, sometimes ears, teeth hurt.  Patient states she can not come in and that you will usually call her in a ZPak  [facility] [place] CB# [phone]

## 2018-06-28 NOTE — Telephone Encounter (Signed)
Phone visit 06/29/2018 at 10:20 with Maurine Minister.   Thanks,   -Vernona Rieger

## 2018-06-28 NOTE — Telephone Encounter (Signed)
Dr. Sherrie Mustache is out of the office until Friday. Please review.

## 2018-06-29 ENCOUNTER — Other Ambulatory Visit: Payer: Self-pay

## 2018-06-29 ENCOUNTER — Encounter: Payer: Self-pay | Admitting: Family Medicine

## 2018-06-29 ENCOUNTER — Ambulatory Visit (INDEPENDENT_AMBULATORY_CARE_PROVIDER_SITE_OTHER): Payer: Medicare PPO | Admitting: Family Medicine

## 2018-06-29 VITALS — BP 159/63 | Temp 97.6°F

## 2018-06-29 DIAGNOSIS — J019 Acute sinusitis, unspecified: Secondary | ICD-10-CM

## 2018-06-29 DIAGNOSIS — J301 Allergic rhinitis due to pollen: Secondary | ICD-10-CM | POA: Diagnosis not present

## 2018-06-29 MED ORDER — AZITHROMYCIN 250 MG PO TABS: ORAL_TABLET | ORAL | 0 refills | Status: DC

## 2018-06-29 NOTE — Progress Notes (Signed)
Erica Smith  MRN: [id number] DOB: [birth year]  Subjective:  HPI   Virtual Visit via Telephone Note  I connected with Arneta Cliche on 06/29/18 at 10:20 AM EDT by telephone and verified that I am speaking with the correct person using two identifiers.  Location: Patient: Home Provider: Office   I discussed the limitations, risks, security and privacy concerns of performing an evaluation and management service by telephone and the availability of in person appointments. I also discussed with the patient that there may be a patient responsible charge related to this service. The patient expressed understanding and agreed to proceed.   The patient is a 78 year old female who presents via phone visit for possible sinus infection.   Patient Active Problem List   Diagnosis Date Noted  . Insomnia 07/21/2016  . Allergic state 07/18/2015  . Cardiac dysrhythmia 07/18/2015  . Arthritis 07/18/2015  . Clinical depression 07/18/2015  . Acid reflux 07/18/2015  . 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 Osteoporosis 09/11/2014  . Fracture of sacrum (HCC) 06/27/2014  . Closed fracture of single pubic ramus of pelvis (HCC) 06/18/2014  . 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 Peripheral nerve disease 01/05/2013  . H/O total hip arthroplasty 11/15/2012  . Irritable bowel syndrome 03/02/2007  . Chronic constipation 09/08/2005  . Hyperlipidemia 03/02/2003  . Essential (primary) hypertension 06/16/1998   Past Medical History:  Diagnosis Date  . Bursitis of left hip   . Depression   . GERD (gastroesophageal reflux disease)   . Hypercholesteremia   . Hypertension   . Osteoporosis   . Pleural effusion 2008   after hip surgery  . Seasonal allergies   . Swelling of left lower extremity   . Wears dentures    partial upper   Social History   Socioeconomic History  . Marital status: Married    Spouse name: Not on file  . Number of children: 1  . Years of  education: Not on file  . Highest education level: 12th grade  Occupational History  . Occupation: retired  Engineer, production  . Financial resource strain: Not hard at all  . Food insecurity:    Worry: Never true    Inability: Never true  . Transportation needs:    Medical: No    Non-medical: No  Tobacco Use  . Smoking status: Never Smoker  . Smokeless tobacco: Never Used  Substance and Sexual Activity  . Alcohol use: No  . Drug use: No  . Sexual activity: Not on file  Lifestyle  . Physical activity:    Days per week: Not on file    Minutes per session: Not on file  . Stress: Only a little  Relationships  . Social connections:    Talks on phone: Not on file    Gets together: Not on file    Attends religious service: Not on file    Active member of club or organization: Not on file    Attends meetings of clubs or organizations: Not on file    Relationship status: Not on file  . Intimate partner violence:    Fear of current or ex partner: Not on file    Emotionally abused: Not on file    Physically abused: Not on file    Forced sexual activity: Not on file  Other Topics Concern  . Not on file  Social History Narrative  . Not on file    Outpatient Encounter Medications as of 06/29/2018  Medication Sig Note  . aspirin 81 MG tablet  08/21/2014: Received from: [facility]  . Calcium Carb-Cholecalciferol (CALTRATE 600+D) 600-800 MG-UNIT TABS Take 2 capsules by mouth daily.  08/21/2014: Received from: [facility]  . Cholecalciferol (VITAMIN D3) 1000 units CAPS Take 1 capsule by mouth 2 (two) times daily.   . Glucosamine-Chondroit-Vit C-Mn (GLUCOSAMINE CHONDR 1500 COMPLX) CAPS Take 1,500 capsules by mouth daily.  08/21/2014: Received from: [facility]  . hydrochlorothiazide (HYDRODIURIL) 25 MG tablet Take 1 tablet (25 mg total) by mouth daily.   Marland Kitchen. ibandronate (BONIVA) 150 MG tablet Take 1 tablet (150 mg total) by mouth every 30  (thirty) days.   . lansoprazole (PREVACID) 30 MG capsule TAKE 1 CAPSULE EVERY DAY   . LIDODERM 5 % APPLY 1 PATCH FOR 12 HOURS IN A 24 HOUR PERIOD as needed 07/18/2015: Received from: External Pharmacy  . lubiprostone (AMITIZA) 8 MCG capsule Take 1 capsule (8 mcg total) by mouth daily.   . metoprolol succinate (TOPROL-XL) 100 MG 24 hr tablet TAKE 1 AND 1/2 TABLETS EVERY DAY   . MULTIPLE VITAMIN PO daily.  08/21/2014: Received from: [facility]  . naproxen sodium (ALEVE) 220 MG tablet Take 220 mg by mouth as needed.   . nortriptyline (PAMELOR) 50 MG capsule TAKE 2 CAPSULES AT BEDTIME   . simvastatin (ZOCOR) 40 MG tablet TAKE 1 TABLET EVERY DAY   . triamcinolone (NASACORT AQ) 55 MCG/ACT AERO nasal inhaler Place 1 spray into the nose daily.  08/21/2014: Received from: [facility]  . zolpidem (AMBIEN CR) 12.5 MG CR tablet Take 1 tablet (12.5 mg total) by mouth at bedtime as needed for sleep.    No facility-administered encounter medications on file as of 06/29/2018.     Allergies  Allergen Reactions  . Alendronate Sodium     Pt denies   . Fish Oil     upset stomach, pt denies   . Fluticasone Propionate     Other reaction(s): Dizzyness, pt denies   . Linzess [Linaclotide]     Dizziness, abdominal pain  . Lovastatin     Pt denies   . Bextra [Valdecoxib] Rash  . Ceftin [Cefuroxime Axetil] Rash  . Celebrex [Celecoxib] Rash  . Ciprofloxacin Rash  . Phazyme [Simethicone] Rash  . Sulfamethoxazole-Trimethoprim Rash         Review of Systems  Constitutional: Negative for chills and fever.  HENT: Positive for congestion, sinus pain and sore throat.   Eyes: Negative.   Respiratory: Negative for cough, hemoptysis, shortness of breath and wheezing.   Cardiovascular: Negative.   Gastrointestinal: Negative.   Skin: Negative.     Objective:  BP (!) 159/63   Temp 97.6 F (36.4 C)   Physical Exam: 12 minute telephonic interview only.  Assessment and  Plan :   1. Subacute sinusitis, unspecified location Developed sinus congestion with green to yellow mucus production, PND, occasional earache and headache. No fever, significant cough, shortness of breath or fatigue. Allergic to several antibiotics (last sinusitis in Nov. 2019 she had a "rash" with taking Doxycycline - progressed to shingles). Will treat with Azithromycin, Nasonex, Aleve and saltwater gargles. May use Mucinex and antihistamine if needed. Increase fluid intake and maintain COVID-19 restrictions (stay home, wear face covering when leaving home, wash hands frequently and maintain social distancing). - azithromycin (ZITHROMAX) 250 MG tablet; Take 2 tablets by mouth today then one daily for 4 days.  Dispense: 6 tablet; Refill: 0  2. Seasonal allergic  rhinitis due to pollen Has been having some sneezing, nasal congestion and itchy eyes the past few weeks. Should continue Nasonex and Allergy Eyedrops. May add Claritin or Zyrtec prn.  I discussed the assessment and treatment plan with the patient. The patient was provided an opportunity to ask questions and all were answered. The patient agreed with the plan and demonstrated an understanding of the instructions.   The patient was advised to call back or seek an in-person evaluation if the symptoms worsen or if the condition fails to improve as anticipated.  I provided 12 minutes of non-face-to-face time during this encounter.
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
## 2018-07-26 ENCOUNTER — Other Ambulatory Visit: Payer: Self-pay

## 2018-07-26 MED ORDER — ZOLPIDEM TARTRATE ER 12.5 MG PO TBCR: 12.5000 mg | EXTENDED_RELEASE_TABLET | Freq: Every evening | ORAL | 5 refills | Status: DC | PRN

## 2018-07-31 ENCOUNTER — Other Ambulatory Visit: Payer: Self-pay

## 2018-07-31 ENCOUNTER — Ambulatory Visit (INDEPENDENT_AMBULATORY_CARE_PROVIDER_SITE_OTHER): Payer: Medicare PPO

## 2018-07-31 ENCOUNTER — Ambulatory Visit: Payer: Medicare PPO

## 2018-07-31 DIAGNOSIS — Z0000 Encounter for general adult medical examination without abnormal findings: Secondary | ICD-10-CM | POA: Diagnosis not present

## 2018-07-31 NOTE — Patient Instructions (Addendum)
[name] , Thank you for taking time to come for your Medicare Wellness Visit. I appreciate your ongoing commitment to your health goals. Please review the following plan we discussed and let me know if I can assist you in the future.   Screening recommendations/referrals: Colonoscopy: No longer required.  Mammogram: No longer required.  Bone Density: Up to date, due 07/2020 Recommended yearly ophthalmology/optometry visit for glaucoma screening and checkup Recommended yearly dental visit for hygiene and checkup  Vaccinations: Influenza vaccine: Up to date Pneumococcal vaccine: Completed series Tdap vaccine: Pt declines today.  Shingles vaccine: Pt declines today.     Advanced directives: Please bring a copy of your POA (Power of Attorney) and/or Living Will to your next appointment.   Conditions/risks identified: Continue to increase water intake to 6-8 8 oz glasses of water a day.   Next appointment: Pt declined scheduling a CPE for this year or an AWV for 2021. Pt to speak with transportation assistance and call back.    Preventive Care 55 Years and Older, Female Preventive care refers to lifestyle choices and visits with your health care provider that can promote health and wellness. What does preventive care include?  A yearly physical exam. This is also called an annual well check.  Dental exams once or twice a year.  Routine eye exams. Ask your health care provider how often you should have your eyes checked.  Personal lifestyle choices, including:  Daily care of your teeth and gums.  Regular physical activity.  Eating a healthy diet.  Avoiding tobacco and drug use.  Limiting alcohol use.  Practicing safe sex.  Taking low-dose aspirin every day.  Taking vitamin and mineral supplements as recommended by your health care provider. What happens during an annual well check? The services and screenings done by your health care provider during your annual well  check will depend on your age, overall health, lifestyle risk factors, and family history of disease. Counseling  Your health care provider may ask you questions about your:  Alcohol use.  Tobacco use.  Drug use.  Emotional well-being.  Home and relationship well-being.  Sexual activity.  Eating habits.  History of falls.  Memory and ability to understand (cognition).  Work and work Astronomer.  Reproductive health. Screening  You may have the following tests or measurements:  Height, weight, and BMI.  Blood pressure.  Lipid and cholesterol levels. These may be checked every 5 years, or more frequently if you are over 72 years old.  Skin check.  Lung cancer screening. You may have this screening every year starting at age 45 if you have a 30-pack-year history of smoking and currently smoke or have quit within the past 15 years.  Fecal occult blood test (FOBT) of the stool. You may have this test every year starting at age 80.  Flexible sigmoidoscopy or colonoscopy. You may have a sigmoidoscopy every 5 years or a colonoscopy every 10 years starting at age 13.  Hepatitis C blood test.  Hepatitis B blood test.  Sexually transmitted disease (STD) testing.  Diabetes screening. This is done by checking your blood sugar (glucose) after you have not eaten for a while (fasting). You may have this done every 1-3 years.  Bone density scan. This is done to screen for osteoporosis. You may have this done starting at age 42.  Mammogram. This may be done every 1-2 years. Talk to your health care provider about how often you should have regular mammograms. Talk with your  health care provider about your test results, treatment options, and if necessary, the need for more tests. Vaccines  Your health care provider may recommend certain vaccines, such as:  Influenza vaccine. This is recommended every year.  Tetanus, diphtheria, and acellular pertussis (Tdap, Td) vaccine. You  may need a Td booster every 10 years.  Zoster vaccine. You may need this after age 52.  Pneumococcal 13-valent conjugate (PCV13) vaccine. One dose is recommended after age 65.  Pneumococcal polysaccharide (PPSV23) vaccine. One dose is recommended after age 31. Talk to your health care provider about which screenings and vaccines you need and how often you need them. This information is not intended to replace advice given to you by your health care provider. Make sure you discuss any questions you have with your health care provider. Document Released: 03/14/2015 Document Revised: 11/05/2015 Document Reviewed: 12/17/2014 Elsevier Interactive Patient Education  2017 [place] Prevention in the Home Falls can cause injuries. They can happen to people of all ages. There are many things you can do to make your home safe and to help prevent falls. What can I do on the outside of my home?  Regularly fix the edges of walkways and driveways and fix any cracks.  Remove anything that might make you trip as you walk through a door, such as a raised step or threshold.  Trim any bushes or trees on the path to your home.  Use bright outdoor lighting.  Clear any walking paths of anything that might make someone trip, such as rocks or tools.  Regularly check to see if handrails are loose or broken. Make sure that both sides of any steps have handrails.  Any raised decks and porches should have guardrails on the edges.  Have any leaves, snow, or ice cleared regularly.  Use sand or salt on walking paths during winter.  Clean up any spills in your garage right away. This includes oil or grease spills. What can I do in the bathroom?  Use night lights.  Install grab bars by the toilet and in the tub and shower. Do not use towel bars as grab bars.  Use non-skid mats or decals in the tub or shower.  If you need to sit down in the shower, use a plastic, non-slip stool.  Keep the floor  dry. Clean up any water that spills on the floor as soon as it happens.  Remove soap buildup in the tub or shower regularly.  Attach bath mats securely with double-sided non-slip rug tape.  Do not have throw rugs and other things on the floor that can make you trip. What can I do in the bedroom?  Use night lights.  Make sure that you have a light by your bed that is easy to reach.  Do not use any sheets or blankets that are too big for your bed. They should not hang down onto the floor.  Have a firm chair that has side arms. You can use this for support while you get dressed.  Do not have throw rugs and other things on the floor that can make you trip. What can I do in the kitchen?  Clean up any spills right away.  Avoid walking on wet floors.  Keep items that you use a lot in easy-to-reach places.  If you need to reach something above you, use a strong step stool that has a grab bar.  Keep electrical cords out of the way.  Do not use  floor polish or wax that makes floors slippery. If you must use wax, use non-skid floor wax.  Do not have throw rugs and other things on the floor that can make you trip. What can I do with my stairs?  Do not leave any items on the stairs.  Make sure that there are handrails on both sides of the stairs and use them. Fix handrails that are broken or loose. Make sure that handrails are as long as the stairways.  Check any carpeting to make sure that it is firmly attached to the stairs. Fix any carpet that is loose or worn.  Avoid having throw rugs at the top or bottom of the stairs. If you do have throw rugs, attach them to the floor with carpet tape.  Make sure that you have a light switch at the top of the stairs and the bottom of the stairs. If you do not have them, ask someone to add them for you. What else can I do to help prevent falls?  Wear shoes that:  Do not have high heels.  Have rubber bottoms.  Are comfortable and fit you  well.  Are closed at the toe. Do not wear sandals.  If you use a stepladder:  Make sure that it is fully opened. Do not climb a closed stepladder.  Make sure that both sides of the stepladder are locked into place.  Ask someone to hold it for you, if possible.  Clearly mark and make sure that you can see:  Any grab bars or handrails.  First and last steps.  Where the edge of each step is.  Use tools that help you move around (mobility aids) if they are needed. These include:  Canes.  Walkers.  Scooters.  Crutches.  Turn on the lights when you go into a dark area. Replace any light bulbs as soon as they burn out.  Set up your furniture so you have a clear path. Avoid moving your furniture around.  If any of your floors are uneven, fix them.  If there are any pets around you, be aware of where they are.  Review your medicines with your doctor. Some medicines can make you feel dizzy. This can increase your chance of falling. Ask your doctor what other things that you can do to help prevent falls. This information is not intended to replace advice given to you by your health care provider. Make sure you discuss any questions you have with your health care provider. Document Released: 12/12/2008 Document Revised: 07/24/2015 Document Reviewed: 03/22/2014 Elsevier Interactive Patient Education  2017 [employer].

## 2018-07-31 NOTE — Progress Notes (Signed)
Subjective:   Erica Smith is a 78 y.o. female who presents for Medicare Annual (Subsequent) preventive examination.    This visit is being conducted through telemedicine due to the COVID-19 pandemic. This patient has given me verbal consent via doximity to conduct this visit, patient states they are participating from their home address. Some vital signs may be absent or patient reported.    Patient identification: identified by name, DOB, and current address  Review of Systems:  N/A  Cardiac Risk Factors include: advanced age (>18men, >77 women);dyslipidemia;hypertension     Objective:     Vitals: There were no vitals taken for this visit.  There is no height or weight on file to calculate BMI. Unable to obtain vitals due to visit being conducted via telephonically.   Advanced Directives 07/31/2018 07/27/2017 07/21/2016 12/04/2015 12/18/2014  Does Patient Have a Medical Advance Directive? Yes Yes Yes Yes Yes  Type of Estate agent of [place];Living will Living will Living will Living will Healthcare Power of [place];Living will  Does patient want to make changes to medical advance directive? - - - - No - Patient declined  Copy of Healthcare Power of Attorney in Chart? No - copy requested - - - No - copy requested    Tobacco Social History   Tobacco Use  Smoking Status Never Smoker  Smokeless Tobacco Never Used     Counseling given: Not Answered   Clinical Intake:  Pre-visit preparation completed: Yes  Pain : No/denies pain Pain Score: 0-No pain     Nutritional Status: BMI 25 -29 Overweight Nutritional Risks: None Diabetes: No  How often do you need to have someone help you when you read instructions, pamphlets, or other written materials from your doctor or pharmacy?: 1 - Never  Interpreter Needed?: No  Information entered by :: [facility], LPN  Past Medical History:  Diagnosis Date  . Bursitis of left hip   . Depression   . GERD  (gastroesophageal reflux disease)   . Hypercholesteremia   . Hypertension   . Osteoporosis   . Pleural effusion 2008   after hip surgery  . Seasonal allergies   . Swelling of left lower extremity   . Wears dentures    partial upper   Past Surgical History:  Procedure Laterality Date  . BREAST BIOPSY Bilateral    neg  . BREAST SURGERY Bilateral    cyst removal  . CATARACT EXTRACTION Left   . CATARACT EXTRACTION W/PHACO Right 12/18/2014   Procedure: CATARACT EXTRACTION PHACO AND INTRAOCULAR LENS PLACEMENT (IOC);  Surgeon: Lockie Mola, MD;  Location[facility];  Service: Ophthalmology;  Laterality: Right;  . JOINT REPLACEMENT Left 2008   hip  . OVARIAN CYST REMOVAL    . TONSILLECTOMY    . TOTAL HIP REVISION Left 2008   Family History  Problem Relation Age of Onset  . Heart disease Mother   . Diabetes Mother   . Hypertension Mother   . Stroke Mother   . Hypertension Father   . Heart attack Father   . Cancer Brother        throat and liver  . Hypertension Sister   . Breast cancer Neg Hx    Social History   Socioeconomic History  . Marital status: Married    Spouse name: Not on file  . Number of children: 1  . Years of education: Not on file  . Highest education level: 12th grade  Occupational History  . Occupation: retired  Social Needs  . Financial resource strain: Not hard at all  . Food insecurity:    Worry: Never true    Inability: Never true  . Transportation needs:    Medical: No    Non-medical: No  Tobacco Use  . Smoking status: Never Smoker  . Smokeless tobacco: Never Used  Substance and Sexual Activity  . Alcohol use: No  . Drug use: No  . Sexual activity: Not on file  Lifestyle  . Physical activity:    Days per week: 0 days    Minutes per session: 0 min  . Stress: Only a little  Relationships  . Social connections:    Talks on phone: Patient refused    Gets together: Patient refused    Attends religious service: Patient  refused    Active member of club or organization: Patient refused    Attends meetings of clubs or organizations: Patient refused    Relationship status: Patient refused  Other Topics Concern  . Not on file  Social History Narrative  . Not on file    Outpatient Encounter Medications as of 07/31/2018  Medication Sig  . aspirin 81 MG tablet   . Cholecalciferol (VITAMIN D3) 1000 units CAPS Take 1 capsule by mouth 2 (two) times daily.  . Glucosamine-Chondroit-Vit C-Mn (GLUCOSAMINE CHONDR 1500 COMPLX) CAPS Take 1,500 capsules by mouth daily.   . hydrochlorothiazide (HYDRODIURIL) 25 MG tablet Take 1 tablet (25 mg total) by mouth daily.  Marland Kitchen ibandronate (BONIVA) 150 MG tablet Take 1 tablet (150 mg total) by mouth every 30 (thirty) days.  . lansoprazole (PREVACID) 30 MG capsule TAKE 1 CAPSULE EVERY DAY  . metoprolol succinate (TOPROL-XL) 100 MG 24 hr tablet TAKE 1 AND 1/2 TABLETS EVERY DAY  . MULTIPLE VITAMIN PO daily.   . naproxen sodium (ALEVE) 220 MG tablet Take 220 mg by mouth as needed.  . nortriptyline (PAMELOR) 50 MG capsule TAKE 2 CAPSULES AT BEDTIME  . simvastatin (ZOCOR) 40 MG tablet TAKE 1 TABLET EVERY DAY  . triamcinolone (NASACORT AQ) 55 MCG/ACT AERO nasal inhaler Place 1 spray into the nose daily.   Marland Kitchen zolpidem (AMBIEN CR) 12.5 MG CR tablet Take 1 tablet (12.5 mg total) by mouth at bedtime as needed for sleep.  Marland Kitchen azithromycin (ZITHROMAX) 250 MG tablet Take 2 tablets by mouth today then one daily for 4 days. (Patient not taking: Reported on 07/31/2018)  . Calcium Carb-Cholecalciferol (CALTRATE 600+D) 600-800 MG-UNIT TABS Take 2 capsules by mouth daily.   Marland Kitchen LIDODERM 5 % APPLY 1 PATCH FOR 12 HOURS IN A 24 HOUR PERIOD as needed  . lubiprostone (AMITIZA) 8 MCG capsule Take 1 capsule (8 mcg total) by mouth daily. (Patient not taking: Reported on 07/31/2018)   No facility-administered encounter medications on file as of 07/31/2018.     Activities of Daily Living In your present state of health,  do you have any difficulty performing the following activities: 07/31/2018  Hearing? N  Vision? N  Difficulty concentrating or making decisions? N  Walking or climbing stairs? N  Comment Due to hip pains.  Dressing or bathing? N  Doing errands, shopping? N  Preparing Food and eating ? N  Using the Toilet? N  In the past six months, have you accidently leaked urine? N  Do you have problems with loss of bowel control? N  Managing your Medications? N  Managing your Finances? N  Housekeeping or managing your Housekeeping? N  Some recent data might be hidden  Patient Care Team: Malva Limes, MD as PCP - General (Family Medicine) Wilhemina Bonito, MD (Physical Medicine and Rehabilitation) Candice Camp, MD as Consulting Physician (Ophthalmology)    Assessment:   This is a routine wellness examination for Talaysha.  Exercise Activities and Dietary recommendations Current Exercise Habits: The patient does not participate in regular exercise at present, Exercise limited by: None identified  Goals    . DIET - INCREASE WATER INTAKE     Recommend increasing water intake to 406 glasses a day.     . Increase water intake     Recommend increasing water intake to 3-4 glasses a day.       Fall Risk: Fall Risk  07/31/2018 07/27/2017 07/21/2016 07/23/2015  Falls in the past year? 0 No No Yes  Number falls in past yr: - - - 1  Injury with Fall? - - - Yes  Comment - - - passed out and fell in May 2016, broke pelvis bane    FALL RISK PREVENTION PERTAINING TO THE HOME:  Any stairs in or around the home? Yes  If so, are there any without handrails? Yes   Home free of loose throw rugs in walkways, pet beds, electrical cords, etc? Yes  Adequate lighting in your home to reduce risk of falls? Yes   ASSISTIVE DEVICES UTILIZED TO PREVENT FALLS:  Life alert? Yes  Use of a cane, walker or w/c? No  Grab bars in the bathroom? Yes  Shower chair or bench in shower? Yes  Elevated toilet seat or  a handicapped toilet? Yes    TIMED UP AND GO:  Was the test performed? No .    Depression Screen PHQ 2/9 Scores 07/31/2018 07/27/2017 07/27/2017 07/21/2016  PHQ - 2 Score 0 0 0 0  PHQ- 9 Score - 0 - -     Cognitive Function     6CIT Screen 07/31/2018 07/27/2017 07/21/2016  What Year? 0 points 0 points 0 points  What month? 0 points 0 points 0 points  What time? 0 points 0 points 0 points  Count back from 20 0 points 0 points 0 points  Months in reverse 0 points 0 points 0 points  Repeat phrase 0 points 2 points 4 points  Total Score 0 2 4    Immunization History  Administered Date(s) Administered  . Influenza, High Dose Seasonal PF 11/21/2014, 11/21/2015, 12/10/2016, 11/17/2017  . Influenza-Unspecified 10/30/2013  . Pneumococcal Conjugate-13 12/14/2013  . Pneumococcal Polysaccharide-23 12/28/2003  . Td 08/30/2000    Qualifies for Shingles Vaccine? Yes . Due for Shingrix. Education has been provided regarding the importance of this vaccine. Pt has been advised to call insurance company to determine out of pocket expense. Advised may also receive vaccine at local pharmacy or Health Dept. Verbalized acceptance and understanding.  Tdap: Pt declines today.   Flu Vaccine: Up to date  Pneumococcal Vaccine: Completed series  Screening Tests Health Maintenance  Topic Date Due  . TETANUS/TDAP  03/01/2026 (Originally 08/31/2010)  . INFLUENZA VACCINE  09/30/2018  . DEXA SCAN  08/09/2020  . PNA vac Low Risk Adult  Completed    Cancer Screenings:  Colorectal Screening: No longer required.   Mammogram: No longer required.   Bone Density: Completed 08/09/17. Results reflect  OSTEOPENIA. Repeat every 3 years.   Lung Cancer Screening: (Low Dose CT Chest recommended if Age 62-80 years, 30 pack-year currently smoking OR have quit w/in 15years.) does not qualify.   Additional Screening:  Vision Screening: Recommended annual  ophthalmology exams for early detection of glaucoma and  other disorders of the eye.  Dental Screening: Recommended annual dental exams for proper oral hygiene  Community Resource Referral:  CRR required this visit?  No       Plan:  I have personally reviewed and addressed the Medicare Annual Wellness questionnaire and have noted the following in the patient's chart:  A. Medical and social history B. Use of alcohol, tobacco or illicit drugs  C. Current medications and supplements D. Functional ability and status E.  Nutritional status F.  Physical activity G. Advance directives H. List of other physicians I.  Hospitalizations, surgeries, and ER visits in previous 12 months J.  Vitals K. Screenings such as hearing and vision if needed, cognitive and depression L. Referrals and appointments   In addition, I have reviewed and discussed with patient certain preventive protocols, quality metrics, and best practice recommendations. A written personalized care plan for preventive services as well as general preventive health recommendations were provided to patient. Nurse Health Advisor  Signed,    Jerryl Holzhauer [place], [place]  1/6/10966/03/2018 Nurse Health Advisor   Nurse Notes: None.

## 2018-08-02 ENCOUNTER — Other Ambulatory Visit: Payer: Self-pay | Admitting: Family Medicine

## 2018-08-03 NOTE — Telephone Encounter (Signed)
Please review

## 2018-08-31 ENCOUNTER — Other Ambulatory Visit: Payer: Self-pay | Admitting: Family Medicine

## 2019-01-01 DIAGNOSIS — S92514A Nondisplaced fracture of proximal phalanx of right lesser toe(s), initial encounter for closed fracture: Secondary | ICD-10-CM | POA: Diagnosis not present

## 2019-01-01 DIAGNOSIS — M79671 Pain in right foot: Secondary | ICD-10-CM | POA: Diagnosis not present

## 2019-01-22 ENCOUNTER — Other Ambulatory Visit: Payer: Self-pay | Admitting: Family Medicine

## 2019-01-22 MED ORDER — ZOLPIDEM TARTRATE ER 12.5 MG PO TBCR: 12.5000 mg | EXTENDED_RELEASE_TABLET | Freq: Every evening | ORAL | 5 refills | Status: AC | PRN

## 2019-01-22 NOTE — Telephone Encounter (Signed)
From [facility] 

## 2019-01-22 NOTE — Telephone Encounter (Signed)
rx refill zolpidem (AMBIEN CR) 12.5 MG CR tablet  PHARMACY [id number] - [place], [place] [address] [phone] (Phone) [phone] (Fax)

## 2019-02-07 ENCOUNTER — Other Ambulatory Visit: Payer: Self-pay | Admitting: Family Medicine

## 2019-02-08 NOTE — Telephone Encounter (Signed)
Requested Prescriptions  Pending Prescriptions Disp Refills  . lansoprazole (PREVACID) 30 MG capsule [Pharmacy Med Name: LANSOPRAZOLE 30 MG Capsule Delayed Release] 90 capsule 4    Sig: TAKE 1 CAPSULE EVERY DAY     Gastroenterology: Proton Pump Inhibitors Passed - 02/07/2019 10:52 PM      Passed - Valid encounter within last 12 months    Recent Outpatient Visits          7 months ago Subacute sinusitis, unspecified location   [place], [place]   1 year ago Chronic constipation   [facility] Birdie Sons, MD   1 year ago Annual physical exam   [facility] Birdie Sons, MD   2 years ago Annual physical exam   [facility] Birdie Sons, MD   2 years ago Upper respiratory tract infection, unspecified type   [facility] Birdie Sons, MD

## 2019-03-22 ENCOUNTER — Other Ambulatory Visit: Payer: Self-pay | Admitting: Family Medicine

## 2019-04-25 ENCOUNTER — Other Ambulatory Visit: Payer: Self-pay | Admitting: Family Medicine

## 2019-04-25 NOTE — Telephone Encounter (Signed)
Requested Prescriptions  Pending Prescriptions Disp Refills  . simvastatin (ZOCOR) 40 MG tablet [Pharmacy Med Name: SIMVASTATIN 40 MG Tablet] 90 tablet 4    Sig: TAKE 1 TABLET EVERY DAY     Cardiovascular:  Antilipid - Statins Failed - 04/25/2019  1:32 PM      Failed - Total Cholesterol in normal range and within 360 days    Cholesterol, Total  Date Value Ref Range Status  08/03/2017 163 100 - 199 mg/dL Final         Failed - LDL in normal range and within 360 days    LDL Calculated  Date Value Ref Range Status  08/03/2017 86 0 - 99 mg/dL Final         Failed - HDL in normal range and within 360 days    HDL  Date Value Ref Range Status  08/03/2017 44 >39 mg/dL Final         Failed - Triglycerides in normal range and within 360 days    Triglycerides  Date Value Ref Range Status  08/03/2017 167 (H) 0 - 149 mg/dL Final         Passed - Patient is not pregnant      Passed - Valid encounter within last 12 months    Recent Outpatient Visits          10 months ago Subacute sinusitis, unspecified location   [facility] Chrismon, [place], [place]   1 year ago Chronic constipation   [facility] Malva Limes, MD   1 year ago Annual physical exam   [facility] Malva Limes, MD   2 years ago Annual physical exam   [facility] Malva Limes, MD   3 years ago Upper respiratory tract infection, unspecified type   [facility] Malva Limes, MD

## 2019-05-11 DIAGNOSIS — S72102A Unspecified trochanteric fracture of left femur, initial encounter for closed fracture: Secondary | ICD-10-CM | POA: Diagnosis not present

## 2019-05-11 DIAGNOSIS — Z79899 Other long term (current) drug therapy: Secondary | ICD-10-CM | POA: Diagnosis not present

## 2019-05-11 DIAGNOSIS — M6281 Muscle weakness (generalized): Secondary | ICD-10-CM | POA: Diagnosis not present

## 2019-05-11 DIAGNOSIS — I10 Essential (primary) hypertension: Secondary | ICD-10-CM | POA: Diagnosis not present

## 2019-05-11 DIAGNOSIS — S72142A Displaced intertrochanteric fracture of left femur, initial encounter for closed fracture: Secondary | ICD-10-CM | POA: Diagnosis not present

## 2019-05-11 DIAGNOSIS — S72352A Displaced comminuted fracture of shaft of left femur, initial encounter for closed fracture: Secondary | ICD-10-CM | POA: Diagnosis not present

## 2019-05-11 DIAGNOSIS — G8911 Acute pain due to trauma: Secondary | ICD-10-CM | POA: Diagnosis not present

## 2019-05-11 DIAGNOSIS — S0990XA Unspecified injury of head, initial encounter: Secondary | ICD-10-CM | POA: Diagnosis not present

## 2019-05-11 DIAGNOSIS — R0902 Hypoxemia: Secondary | ICD-10-CM | POA: Diagnosis not present

## 2019-05-11 DIAGNOSIS — S299XXA Unspecified injury of thorax, initial encounter: Secondary | ICD-10-CM | POA: Diagnosis not present

## 2019-05-11 DIAGNOSIS — W01198A Fall on same level from slipping, tripping and stumbling with subsequent striking against other object, initial encounter: Secondary | ICD-10-CM | POA: Diagnosis not present

## 2019-05-11 DIAGNOSIS — D62 Acute posthemorrhagic anemia: Secondary | ICD-10-CM | POA: Diagnosis not present

## 2019-05-11 DIAGNOSIS — R9431 Abnormal electrocardiogram [ECG] [EKG]: Secondary | ICD-10-CM | POA: Diagnosis not present

## 2019-05-11 DIAGNOSIS — M9702XD Periprosthetic fracture around internal prosthetic left hip joint, subsequent encounter: Secondary | ICD-10-CM | POA: Diagnosis not present

## 2019-05-11 DIAGNOSIS — Z4889 Encounter for other specified surgical aftercare: Secondary | ICD-10-CM | POA: Diagnosis not present

## 2019-05-11 DIAGNOSIS — R52 Pain, unspecified: Secondary | ICD-10-CM | POA: Diagnosis not present

## 2019-05-11 DIAGNOSIS — E871 Hypo-osmolality and hyponatremia: Secondary | ICD-10-CM | POA: Diagnosis not present

## 2019-05-11 DIAGNOSIS — R8271 Bacteriuria: Secondary | ICD-10-CM | POA: Diagnosis not present

## 2019-05-11 DIAGNOSIS — W1830XA Fall on same level, unspecified, initial encounter: Secondary | ICD-10-CM | POA: Diagnosis not present

## 2019-05-11 DIAGNOSIS — S59902A Unspecified injury of left elbow, initial encounter: Secondary | ICD-10-CM | POA: Diagnosis not present

## 2019-05-11 DIAGNOSIS — R339 Retention of urine, unspecified: Secondary | ICD-10-CM | POA: Diagnosis not present

## 2019-05-11 DIAGNOSIS — M25552 Pain in left hip: Secondary | ICD-10-CM | POA: Diagnosis not present

## 2019-05-11 DIAGNOSIS — S5002XA Contusion of left elbow, initial encounter: Secondary | ICD-10-CM | POA: Diagnosis not present

## 2019-05-11 DIAGNOSIS — Y33XXXA Other specified events, undetermined intent, initial encounter: Secondary | ICD-10-CM | POA: Diagnosis not present

## 2019-05-11 DIAGNOSIS — M9702XA Periprosthetic fracture around internal prosthetic left hip joint, initial encounter: Secondary | ICD-10-CM | POA: Diagnosis not present

## 2019-05-11 DIAGNOSIS — D500 Iron deficiency anemia secondary to blood loss (chronic): Secondary | ICD-10-CM | POA: Diagnosis not present

## 2019-05-11 DIAGNOSIS — E785 Hyperlipidemia, unspecified: Secondary | ICD-10-CM | POA: Diagnosis not present

## 2019-05-11 DIAGNOSIS — W19XXXA Unspecified fall, initial encounter: Secondary | ICD-10-CM | POA: Diagnosis not present

## 2019-05-11 DIAGNOSIS — Z743 Need for continuous supervision: Secondary | ICD-10-CM | POA: Diagnosis not present

## 2019-05-11 DIAGNOSIS — D696 Thrombocytopenia, unspecified: Secondary | ICD-10-CM | POA: Diagnosis not present

## 2019-05-11 DIAGNOSIS — Z96642 Presence of left artificial hip joint: Secondary | ICD-10-CM | POA: Diagnosis not present

## 2019-05-11 DIAGNOSIS — R262 Difficulty in walking, not elsewhere classified: Secondary | ICD-10-CM | POA: Diagnosis not present

## 2019-05-18 DIAGNOSIS — M9702XD Periprosthetic fracture around internal prosthetic left hip joint, subsequent encounter: Secondary | ICD-10-CM | POA: Diagnosis not present

## 2019-05-18 DIAGNOSIS — E785 Hyperlipidemia, unspecified: Secondary | ICD-10-CM | POA: Diagnosis not present

## 2019-05-18 DIAGNOSIS — M810 Age-related osteoporosis without current pathological fracture: Secondary | ICD-10-CM | POA: Diagnosis not present

## 2019-05-18 DIAGNOSIS — Z4889 Encounter for other specified surgical aftercare: Secondary | ICD-10-CM | POA: Diagnosis not present

## 2019-05-18 DIAGNOSIS — R52 Pain, unspecified: Secondary | ICD-10-CM | POA: Diagnosis not present

## 2019-05-18 DIAGNOSIS — R9431 Abnormal electrocardiogram [ECG] [EKG]: Secondary | ICD-10-CM | POA: Diagnosis not present

## 2019-05-18 DIAGNOSIS — Z743 Need for continuous supervision: Secondary | ICD-10-CM | POA: Diagnosis not present

## 2019-05-18 DIAGNOSIS — M6281 Muscle weakness (generalized): Secondary | ICD-10-CM | POA: Diagnosis not present

## 2019-05-18 DIAGNOSIS — K5900 Constipation, unspecified: Secondary | ICD-10-CM | POA: Diagnosis not present

## 2019-05-18 DIAGNOSIS — R339 Retention of urine, unspecified: Secondary | ICD-10-CM | POA: Diagnosis not present

## 2019-05-18 DIAGNOSIS — E871 Hypo-osmolality and hyponatremia: Secondary | ICD-10-CM | POA: Diagnosis not present

## 2019-05-18 DIAGNOSIS — I10 Essential (primary) hypertension: Secondary | ICD-10-CM | POA: Diagnosis not present

## 2019-05-18 DIAGNOSIS — R262 Difficulty in walking, not elsewhere classified: Secondary | ICD-10-CM | POA: Diagnosis not present

## 2019-05-18 DIAGNOSIS — D500 Iron deficiency anemia secondary to blood loss (chronic): Secondary | ICD-10-CM | POA: Diagnosis not present

## 2019-05-21 DIAGNOSIS — M9702XD Periprosthetic fracture around internal prosthetic left hip joint, subsequent encounter: Secondary | ICD-10-CM | POA: Diagnosis not present

## 2019-05-21 DIAGNOSIS — I10 Essential (primary) hypertension: Secondary | ICD-10-CM | POA: Diagnosis not present

## 2019-05-21 DIAGNOSIS — R52 Pain, unspecified: Secondary | ICD-10-CM | POA: Diagnosis not present

## 2019-05-21 DIAGNOSIS — K5900 Constipation, unspecified: Secondary | ICD-10-CM | POA: Diagnosis not present

## 2019-05-21 DIAGNOSIS — M810 Age-related osteoporosis without current pathological fracture: Secondary | ICD-10-CM | POA: Diagnosis not present

## 2019-06-01 ENCOUNTER — Inpatient Hospital Stay: Payer: Medicare PPO | Admitting: Family Medicine

## 2019-06-01 ENCOUNTER — Other Ambulatory Visit: Payer: Self-pay

## 2019-06-01 NOTE — Progress Notes (Deleted)
Established patient visit   {  This note template is in development as part of the [facility] [employer] project.   This template contains optional SmartLists. If no selections are made from an optional SmartList, it will be automatically removed when the note is signed.  Left clicking SmartLists that are in blue, underlined text will show additional information regarding the patient's history unless you are already editing the note in a pop-up window.  This text will be automatically removed from this note when it is signed.:1}   Patient: Erica Smith   DOB: 1940-06-02   79 y.o. Female  MRN: [id number] Visit Date: 06/01/2019  Today's healthcare provider: Mila Merry, MD  Subjective:   No chief complaint on file.  HPI  Follow up Hospitalization  Patient was admitted to [facility] on 05/11/2019 and discharged on 05/18/2019. 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  Treatment for this included Patient started on Lovenox 40 mg daily for DVT prophylaxis to  continue for 28 days postop. Oxycodone 2.5mg  for pain score 4-7  Oxycodone 5mg  for pain score 8-10, and recommended tapering over the next week.  Patient advised to follow up with Ortho on 4/7.  She reports {DESC; EXCELLENT/GOOD/FAIR:19992} compliance with treatment. She reports this condition is {improved/worse/unchanged:3041574}.  ------------------------------------------------------------------------------------    {Show patient history (optional):23778::" "}   Medications: Outpatient Medications Prior to Visit  Medication Sig  . aspirin 81 MG tablet   . azithromycin (ZITHROMAX) 250 MG tablet Take 2 tablets by mouth today then one daily for 4 days. (Patient not taking: Reported on 07/31/2018)  . Calcium Carb-Cholecalciferol (CALTRATE 600+D) 600-800 MG-UNIT TABS Take 2 capsules by mouth daily.   . Cholecalciferol (VITAMIN D3) 1000 units CAPS Take 1 capsule by mouth 2  (two) times daily.  . Glucosamine-Chondroit-Vit C-Mn (GLUCOSAMINE CHONDR 1500 COMPLX) CAPS Take 1,500 capsules by mouth daily.   . hydrochlorothiazide (HYDRODIURIL) 25 MG tablet TAKE 1 TABLET BY MOUTH EVERY DAY  . ibandronate (BONIVA) 150 MG tablet TAKE 1 TABLET EVERY 30 DAYS  . lansoprazole (PREVACID) 30 MG capsule TAKE 1 CAPSULE EVERY DAY  . LIDODERM 5 % APPLY 1 PATCH FOR 12 HOURS IN A 24 HOUR PERIOD as needed  . lubiprostone (AMITIZA) 8 MCG capsule Take 1 capsule (8 mcg total) by mouth daily. (Patient not taking: Reported on 07/31/2018)  . metoprolol succinate (TOPROL-XL) 100 MG 24 hr tablet TAKE 1 AND 1/2 TABLETS EVERY DAY  . MULTIPLE VITAMIN PO daily.   . naproxen sodium (ALEVE) 220 MG tablet Take 220 mg by mouth as needed.  . nortriptyline (PAMELOR) 50 MG capsule TAKE 2 CAPSULES AT BEDTIME  . simvastatin (ZOCOR) 40 MG tablet TAKE 1 TABLET EVERY DAY  . triamcinolone (NASACORT AQ) 55 MCG/ACT AERO nasal inhaler Place 1 spray into the nose daily.   09/30/2018 zolpidem (AMBIEN CR) 12.5 MG CR tablet Take 1 tablet (12.5 mg total) by mouth at bedtime as needed for sleep.   No facility-administered medications prior to visit.    Review of Systems  {Show previous labs (optional):23779::" "}    Objective:    There were no vitals taken for this visit. {Show previous vital signs (optional):23777::" "}  Physical Exam  ***  No results found for any visits on 06/01/19.    Assessment & Plan:    ***     08/01/19, MD  [facility] [phone] (phone) [phone] (fax)  [facility]

## 2019-06-18 DIAGNOSIS — M9702XD Periprosthetic fracture around internal prosthetic left hip joint, subsequent encounter: Secondary | ICD-10-CM | POA: Diagnosis not present

## 2019-06-19 DIAGNOSIS — M9702XD Periprosthetic fracture around internal prosthetic left hip joint, subsequent encounter: Secondary | ICD-10-CM | POA: Diagnosis not present

## 2019-06-19 DIAGNOSIS — D500 Iron deficiency anemia secondary to blood loss (chronic): Secondary | ICD-10-CM | POA: Diagnosis not present

## 2019-06-19 DIAGNOSIS — R339 Retention of urine, unspecified: Secondary | ICD-10-CM | POA: Diagnosis not present

## 2019-06-19 DIAGNOSIS — R41841 Cognitive communication deficit: Secondary | ICD-10-CM | POA: Diagnosis not present

## 2019-06-19 DIAGNOSIS — I10 Essential (primary) hypertension: Secondary | ICD-10-CM | POA: Diagnosis not present

## 2019-06-19 DIAGNOSIS — D696 Thrombocytopenia, unspecified: Secondary | ICD-10-CM | POA: Diagnosis not present

## 2019-06-19 DIAGNOSIS — E785 Hyperlipidemia, unspecified: Secondary | ICD-10-CM | POA: Diagnosis not present

## 2019-06-19 DIAGNOSIS — E871 Hypo-osmolality and hyponatremia: Secondary | ICD-10-CM | POA: Diagnosis not present

## 2019-06-19 DIAGNOSIS — S72002D Fracture of unspecified part of neck of left femur, subsequent encounter for closed fracture with routine healing: Secondary | ICD-10-CM | POA: Diagnosis not present

## 2019-06-21 DIAGNOSIS — E871 Hypo-osmolality and hyponatremia: Secondary | ICD-10-CM | POA: Diagnosis not present

## 2019-06-21 DIAGNOSIS — D500 Iron deficiency anemia secondary to blood loss (chronic): Secondary | ICD-10-CM | POA: Diagnosis not present

## 2019-06-21 DIAGNOSIS — S72002D Fracture of unspecified part of neck of left femur, subsequent encounter for closed fracture with routine healing: Secondary | ICD-10-CM | POA: Diagnosis not present

## 2019-06-21 DIAGNOSIS — M9702XD Periprosthetic fracture around internal prosthetic left hip joint, subsequent encounter: Secondary | ICD-10-CM | POA: Diagnosis not present

## 2019-06-21 DIAGNOSIS — K219 Gastro-esophageal reflux disease without esophagitis: Secondary | ICD-10-CM | POA: Diagnosis not present

## 2019-06-21 DIAGNOSIS — D696 Thrombocytopenia, unspecified: Secondary | ICD-10-CM | POA: Diagnosis not present

## 2019-06-21 DIAGNOSIS — F5101 Primary insomnia: Secondary | ICD-10-CM | POA: Diagnosis not present

## 2019-06-21 DIAGNOSIS — R269 Unspecified abnormalities of gait and mobility: Secondary | ICD-10-CM | POA: Diagnosis not present

## 2019-06-21 DIAGNOSIS — E785 Hyperlipidemia, unspecified: Secondary | ICD-10-CM | POA: Diagnosis not present

## 2019-06-21 DIAGNOSIS — D509 Iron deficiency anemia, unspecified: Secondary | ICD-10-CM | POA: Diagnosis not present

## 2019-06-21 DIAGNOSIS — I10 Essential (primary) hypertension: Secondary | ICD-10-CM | POA: Diagnosis not present

## 2019-06-21 DIAGNOSIS — R41841 Cognitive communication deficit: Secondary | ICD-10-CM | POA: Diagnosis not present

## 2019-06-21 DIAGNOSIS — E782 Mixed hyperlipidemia: Secondary | ICD-10-CM | POA: Diagnosis not present

## 2019-06-21 DIAGNOSIS — R339 Retention of urine, unspecified: Secondary | ICD-10-CM | POA: Diagnosis not present

## 2019-06-21 DIAGNOSIS — S7292XA Unspecified fracture of left femur, initial encounter for closed fracture: Secondary | ICD-10-CM | POA: Diagnosis not present

## 2019-06-27 DIAGNOSIS — R339 Retention of urine, unspecified: Secondary | ICD-10-CM | POA: Diagnosis not present

## 2019-06-27 DIAGNOSIS — E871 Hypo-osmolality and hyponatremia: Secondary | ICD-10-CM | POA: Diagnosis not present

## 2019-06-27 DIAGNOSIS — I10 Essential (primary) hypertension: Secondary | ICD-10-CM | POA: Diagnosis not present

## 2019-06-27 DIAGNOSIS — S72002D Fracture of unspecified part of neck of left femur, subsequent encounter for closed fracture with routine healing: Secondary | ICD-10-CM | POA: Diagnosis not present

## 2019-06-27 DIAGNOSIS — E785 Hyperlipidemia, unspecified: Secondary | ICD-10-CM | POA: Diagnosis not present

## 2019-06-27 DIAGNOSIS — R41841 Cognitive communication deficit: Secondary | ICD-10-CM | POA: Diagnosis not present

## 2019-06-27 DIAGNOSIS — D696 Thrombocytopenia, unspecified: Secondary | ICD-10-CM | POA: Diagnosis not present

## 2019-06-27 DIAGNOSIS — M9702XD Periprosthetic fracture around internal prosthetic left hip joint, subsequent encounter: Secondary | ICD-10-CM | POA: Diagnosis not present

## 2019-06-27 DIAGNOSIS — D500 Iron deficiency anemia secondary to blood loss (chronic): Secondary | ICD-10-CM | POA: Diagnosis not present

## 2019-06-28 DIAGNOSIS — S7292XD Unspecified fracture of left femur, subsequent encounter for closed fracture with routine healing: Secondary | ICD-10-CM | POA: Diagnosis not present

## 2019-06-28 DIAGNOSIS — I10 Essential (primary) hypertension: Secondary | ICD-10-CM | POA: Diagnosis not present

## 2019-06-28 DIAGNOSIS — R269 Unspecified abnormalities of gait and mobility: Secondary | ICD-10-CM | POA: Diagnosis not present

## 2019-06-29 DIAGNOSIS — Z79899 Other long term (current) drug therapy: Secondary | ICD-10-CM | POA: Diagnosis not present

## 2019-06-29 DIAGNOSIS — M9702XD Periprosthetic fracture around internal prosthetic left hip joint, subsequent encounter: Secondary | ICD-10-CM | POA: Diagnosis not present

## 2019-06-29 DIAGNOSIS — R339 Retention of urine, unspecified: Secondary | ICD-10-CM | POA: Diagnosis not present

## 2019-06-29 DIAGNOSIS — E871 Hypo-osmolality and hyponatremia: Secondary | ICD-10-CM | POA: Diagnosis not present

## 2019-06-29 DIAGNOSIS — I10 Essential (primary) hypertension: Secondary | ICD-10-CM | POA: Diagnosis not present

## 2019-06-29 DIAGNOSIS — D500 Iron deficiency anemia secondary to blood loss (chronic): Secondary | ICD-10-CM | POA: Diagnosis not present

## 2019-06-29 DIAGNOSIS — R41841 Cognitive communication deficit: Secondary | ICD-10-CM | POA: Diagnosis not present

## 2019-06-29 DIAGNOSIS — D696 Thrombocytopenia, unspecified: Secondary | ICD-10-CM | POA: Diagnosis not present

## 2019-06-29 DIAGNOSIS — E785 Hyperlipidemia, unspecified: Secondary | ICD-10-CM | POA: Diagnosis not present

## 2019-06-29 DIAGNOSIS — S72002D Fracture of unspecified part of neck of left femur, subsequent encounter for closed fracture with routine healing: Secondary | ICD-10-CM | POA: Diagnosis not present

## 2019-07-02 DIAGNOSIS — E785 Hyperlipidemia, unspecified: Secondary | ICD-10-CM | POA: Diagnosis not present

## 2019-07-02 DIAGNOSIS — S72002D Fracture of unspecified part of neck of left femur, subsequent encounter for closed fracture with routine healing: Secondary | ICD-10-CM | POA: Diagnosis not present

## 2019-07-02 DIAGNOSIS — D696 Thrombocytopenia, unspecified: Secondary | ICD-10-CM | POA: Diagnosis not present

## 2019-07-02 DIAGNOSIS — R41841 Cognitive communication deficit: Secondary | ICD-10-CM | POA: Diagnosis not present

## 2019-07-02 DIAGNOSIS — D500 Iron deficiency anemia secondary to blood loss (chronic): Secondary | ICD-10-CM | POA: Diagnosis not present

## 2019-07-02 DIAGNOSIS — M9702XD Periprosthetic fracture around internal prosthetic left hip joint, subsequent encounter: Secondary | ICD-10-CM | POA: Diagnosis not present

## 2019-07-02 DIAGNOSIS — E871 Hypo-osmolality and hyponatremia: Secondary | ICD-10-CM | POA: Diagnosis not present

## 2019-07-02 DIAGNOSIS — I10 Essential (primary) hypertension: Secondary | ICD-10-CM | POA: Diagnosis not present

## 2019-07-02 DIAGNOSIS — R339 Retention of urine, unspecified: Secondary | ICD-10-CM | POA: Diagnosis not present

## 2019-07-03 DIAGNOSIS — S72002D Fracture of unspecified part of neck of left femur, subsequent encounter for closed fracture with routine healing: Secondary | ICD-10-CM | POA: Diagnosis not present

## 2019-07-03 DIAGNOSIS — I10 Essential (primary) hypertension: Secondary | ICD-10-CM | POA: Diagnosis not present

## 2019-07-03 DIAGNOSIS — M9702XD Periprosthetic fracture around internal prosthetic left hip joint, subsequent encounter: Secondary | ICD-10-CM | POA: Diagnosis not present

## 2019-07-04 DIAGNOSIS — M9702XD Periprosthetic fracture around internal prosthetic left hip joint, subsequent encounter: Secondary | ICD-10-CM | POA: Diagnosis not present

## 2019-07-04 DIAGNOSIS — R339 Retention of urine, unspecified: Secondary | ICD-10-CM | POA: Diagnosis not present

## 2019-07-04 DIAGNOSIS — D696 Thrombocytopenia, unspecified: Secondary | ICD-10-CM | POA: Diagnosis not present

## 2019-07-04 DIAGNOSIS — D500 Iron deficiency anemia secondary to blood loss (chronic): Secondary | ICD-10-CM | POA: Diagnosis not present

## 2019-07-04 DIAGNOSIS — S72002D Fracture of unspecified part of neck of left femur, subsequent encounter for closed fracture with routine healing: Secondary | ICD-10-CM | POA: Diagnosis not present

## 2019-07-04 DIAGNOSIS — E785 Hyperlipidemia, unspecified: Secondary | ICD-10-CM | POA: Diagnosis not present

## 2019-07-04 DIAGNOSIS — E871 Hypo-osmolality and hyponatremia: Secondary | ICD-10-CM | POA: Diagnosis not present

## 2019-07-04 DIAGNOSIS — I10 Essential (primary) hypertension: Secondary | ICD-10-CM | POA: Diagnosis not present

## 2019-07-04 DIAGNOSIS — R41841 Cognitive communication deficit: Secondary | ICD-10-CM | POA: Diagnosis not present

## 2019-07-06 DIAGNOSIS — R41841 Cognitive communication deficit: Secondary | ICD-10-CM | POA: Diagnosis not present

## 2019-07-06 DIAGNOSIS — S72002D Fracture of unspecified part of neck of left femur, subsequent encounter for closed fracture with routine healing: Secondary | ICD-10-CM | POA: Diagnosis not present

## 2019-07-06 DIAGNOSIS — D696 Thrombocytopenia, unspecified: Secondary | ICD-10-CM | POA: Diagnosis not present

## 2019-07-06 DIAGNOSIS — M9702XD Periprosthetic fracture around internal prosthetic left hip joint, subsequent encounter: Secondary | ICD-10-CM | POA: Diagnosis not present

## 2019-07-06 DIAGNOSIS — R339 Retention of urine, unspecified: Secondary | ICD-10-CM | POA: Diagnosis not present

## 2019-07-06 DIAGNOSIS — I10 Essential (primary) hypertension: Secondary | ICD-10-CM | POA: Diagnosis not present

## 2019-07-06 DIAGNOSIS — E785 Hyperlipidemia, unspecified: Secondary | ICD-10-CM | POA: Diagnosis not present

## 2019-07-06 DIAGNOSIS — E871 Hypo-osmolality and hyponatremia: Secondary | ICD-10-CM | POA: Diagnosis not present

## 2019-07-06 DIAGNOSIS — D500 Iron deficiency anemia secondary to blood loss (chronic): Secondary | ICD-10-CM | POA: Diagnosis not present

## 2019-07-11 DIAGNOSIS — M9702XD Periprosthetic fracture around internal prosthetic left hip joint, subsequent encounter: Secondary | ICD-10-CM | POA: Diagnosis not present

## 2019-07-11 DIAGNOSIS — M9702XA Periprosthetic fracture around internal prosthetic left hip joint, initial encounter: Secondary | ICD-10-CM | POA: Diagnosis not present

## 2019-07-12 DIAGNOSIS — E871 Hypo-osmolality and hyponatremia: Secondary | ICD-10-CM | POA: Diagnosis not present

## 2019-07-12 DIAGNOSIS — R339 Retention of urine, unspecified: Secondary | ICD-10-CM | POA: Diagnosis not present

## 2019-07-12 DIAGNOSIS — I10 Essential (primary) hypertension: Secondary | ICD-10-CM | POA: Diagnosis not present

## 2019-07-12 DIAGNOSIS — R41841 Cognitive communication deficit: Secondary | ICD-10-CM | POA: Diagnosis not present

## 2019-07-12 DIAGNOSIS — S72002D Fracture of unspecified part of neck of left femur, subsequent encounter for closed fracture with routine healing: Secondary | ICD-10-CM | POA: Diagnosis not present

## 2019-07-12 DIAGNOSIS — S7292XD Unspecified fracture of left femur, subsequent encounter for closed fracture with routine healing: Secondary | ICD-10-CM | POA: Diagnosis not present

## 2019-07-12 DIAGNOSIS — D696 Thrombocytopenia, unspecified: Secondary | ICD-10-CM | POA: Diagnosis not present

## 2019-07-12 DIAGNOSIS — D509 Iron deficiency anemia, unspecified: Secondary | ICD-10-CM | POA: Diagnosis not present

## 2019-07-12 DIAGNOSIS — I951 Orthostatic hypotension: Secondary | ICD-10-CM | POA: Diagnosis not present

## 2019-07-12 DIAGNOSIS — E785 Hyperlipidemia, unspecified: Secondary | ICD-10-CM | POA: Diagnosis not present

## 2019-07-12 DIAGNOSIS — M9702XD Periprosthetic fracture around internal prosthetic left hip joint, subsequent encounter: Secondary | ICD-10-CM | POA: Diagnosis not present

## 2019-07-12 DIAGNOSIS — D500 Iron deficiency anemia secondary to blood loss (chronic): Secondary | ICD-10-CM | POA: Diagnosis not present

## 2019-07-13 DIAGNOSIS — E871 Hypo-osmolality and hyponatremia: Secondary | ICD-10-CM | POA: Diagnosis not present

## 2019-07-13 DIAGNOSIS — R41841 Cognitive communication deficit: Secondary | ICD-10-CM | POA: Diagnosis not present

## 2019-07-13 DIAGNOSIS — M9702XD Periprosthetic fracture around internal prosthetic left hip joint, subsequent encounter: Secondary | ICD-10-CM | POA: Diagnosis not present

## 2019-07-13 DIAGNOSIS — I10 Essential (primary) hypertension: Secondary | ICD-10-CM | POA: Diagnosis not present

## 2019-07-13 DIAGNOSIS — R339 Retention of urine, unspecified: Secondary | ICD-10-CM | POA: Diagnosis not present

## 2019-07-13 DIAGNOSIS — D696 Thrombocytopenia, unspecified: Secondary | ICD-10-CM | POA: Diagnosis not present

## 2019-07-13 DIAGNOSIS — S72002D Fracture of unspecified part of neck of left femur, subsequent encounter for closed fracture with routine healing: Secondary | ICD-10-CM | POA: Diagnosis not present

## 2019-07-13 DIAGNOSIS — D500 Iron deficiency anemia secondary to blood loss (chronic): Secondary | ICD-10-CM | POA: Diagnosis not present

## 2019-07-13 DIAGNOSIS — E785 Hyperlipidemia, unspecified: Secondary | ICD-10-CM | POA: Diagnosis not present

## 2019-07-16 DIAGNOSIS — I10 Essential (primary) hypertension: Secondary | ICD-10-CM | POA: Diagnosis not present

## 2019-07-16 DIAGNOSIS — D696 Thrombocytopenia, unspecified: Secondary | ICD-10-CM | POA: Diagnosis not present

## 2019-07-16 DIAGNOSIS — D500 Iron deficiency anemia secondary to blood loss (chronic): Secondary | ICD-10-CM | POA: Diagnosis not present

## 2019-07-16 DIAGNOSIS — S72002D Fracture of unspecified part of neck of left femur, subsequent encounter for closed fracture with routine healing: Secondary | ICD-10-CM | POA: Diagnosis not present

## 2019-07-16 DIAGNOSIS — R41841 Cognitive communication deficit: Secondary | ICD-10-CM | POA: Diagnosis not present

## 2019-07-16 DIAGNOSIS — M9702XD Periprosthetic fracture around internal prosthetic left hip joint, subsequent encounter: Secondary | ICD-10-CM | POA: Diagnosis not present

## 2019-07-16 DIAGNOSIS — E785 Hyperlipidemia, unspecified: Secondary | ICD-10-CM | POA: Diagnosis not present

## 2019-07-16 DIAGNOSIS — E871 Hypo-osmolality and hyponatremia: Secondary | ICD-10-CM | POA: Diagnosis not present

## 2019-07-16 DIAGNOSIS — R339 Retention of urine, unspecified: Secondary | ICD-10-CM | POA: Diagnosis not present

## 2019-07-17 DIAGNOSIS — R339 Retention of urine, unspecified: Secondary | ICD-10-CM | POA: Diagnosis not present

## 2019-07-17 DIAGNOSIS — E785 Hyperlipidemia, unspecified: Secondary | ICD-10-CM | POA: Diagnosis not present

## 2019-07-17 DIAGNOSIS — D500 Iron deficiency anemia secondary to blood loss (chronic): Secondary | ICD-10-CM | POA: Diagnosis not present

## 2019-07-17 DIAGNOSIS — M9702XD Periprosthetic fracture around internal prosthetic left hip joint, subsequent encounter: Secondary | ICD-10-CM | POA: Diagnosis not present

## 2019-07-17 DIAGNOSIS — R41841 Cognitive communication deficit: Secondary | ICD-10-CM | POA: Diagnosis not present

## 2019-07-17 DIAGNOSIS — S72002D Fracture of unspecified part of neck of left femur, subsequent encounter for closed fracture with routine healing: Secondary | ICD-10-CM | POA: Diagnosis not present

## 2019-07-17 DIAGNOSIS — I10 Essential (primary) hypertension: Secondary | ICD-10-CM | POA: Diagnosis not present

## 2019-07-17 DIAGNOSIS — D696 Thrombocytopenia, unspecified: Secondary | ICD-10-CM | POA: Diagnosis not present

## 2019-07-17 DIAGNOSIS — E871 Hypo-osmolality and hyponatremia: Secondary | ICD-10-CM | POA: Diagnosis not present

## 2019-07-18 DIAGNOSIS — R41841 Cognitive communication deficit: Secondary | ICD-10-CM | POA: Diagnosis not present

## 2019-07-18 DIAGNOSIS — D500 Iron deficiency anemia secondary to blood loss (chronic): Secondary | ICD-10-CM | POA: Diagnosis not present

## 2019-07-18 DIAGNOSIS — M9702XD Periprosthetic fracture around internal prosthetic left hip joint, subsequent encounter: Secondary | ICD-10-CM | POA: Diagnosis not present

## 2019-07-18 DIAGNOSIS — S72002D Fracture of unspecified part of neck of left femur, subsequent encounter for closed fracture with routine healing: Secondary | ICD-10-CM | POA: Diagnosis not present

## 2019-07-18 DIAGNOSIS — D696 Thrombocytopenia, unspecified: Secondary | ICD-10-CM | POA: Diagnosis not present

## 2019-07-18 DIAGNOSIS — R339 Retention of urine, unspecified: Secondary | ICD-10-CM | POA: Diagnosis not present

## 2019-07-18 DIAGNOSIS — E871 Hypo-osmolality and hyponatremia: Secondary | ICD-10-CM | POA: Diagnosis not present

## 2019-07-18 DIAGNOSIS — I10 Essential (primary) hypertension: Secondary | ICD-10-CM | POA: Diagnosis not present

## 2019-07-18 DIAGNOSIS — E785 Hyperlipidemia, unspecified: Secondary | ICD-10-CM | POA: Diagnosis not present

## 2019-07-19 DIAGNOSIS — R41841 Cognitive communication deficit: Secondary | ICD-10-CM | POA: Diagnosis not present

## 2019-07-19 DIAGNOSIS — S72002D Fracture of unspecified part of neck of left femur, subsequent encounter for closed fracture with routine healing: Secondary | ICD-10-CM | POA: Diagnosis not present

## 2019-07-19 DIAGNOSIS — E871 Hypo-osmolality and hyponatremia: Secondary | ICD-10-CM | POA: Diagnosis not present

## 2019-07-19 DIAGNOSIS — D696 Thrombocytopenia, unspecified: Secondary | ICD-10-CM | POA: Diagnosis not present

## 2019-07-19 DIAGNOSIS — I10 Essential (primary) hypertension: Secondary | ICD-10-CM | POA: Diagnosis not present

## 2019-07-19 DIAGNOSIS — D500 Iron deficiency anemia secondary to blood loss (chronic): Secondary | ICD-10-CM | POA: Diagnosis not present

## 2019-07-19 DIAGNOSIS — M9702XD Periprosthetic fracture around internal prosthetic left hip joint, subsequent encounter: Secondary | ICD-10-CM | POA: Diagnosis not present

## 2019-07-19 DIAGNOSIS — R339 Retention of urine, unspecified: Secondary | ICD-10-CM | POA: Diagnosis not present

## 2019-07-19 DIAGNOSIS — E785 Hyperlipidemia, unspecified: Secondary | ICD-10-CM | POA: Diagnosis not present

## 2019-07-23 DIAGNOSIS — M9702XD Periprosthetic fracture around internal prosthetic left hip joint, subsequent encounter: Secondary | ICD-10-CM | POA: Diagnosis not present

## 2019-07-23 DIAGNOSIS — R41841 Cognitive communication deficit: Secondary | ICD-10-CM | POA: Diagnosis not present

## 2019-07-23 DIAGNOSIS — I10 Essential (primary) hypertension: Secondary | ICD-10-CM | POA: Diagnosis not present

## 2019-07-23 DIAGNOSIS — E785 Hyperlipidemia, unspecified: Secondary | ICD-10-CM | POA: Diagnosis not present

## 2019-07-23 DIAGNOSIS — D500 Iron deficiency anemia secondary to blood loss (chronic): Secondary | ICD-10-CM | POA: Diagnosis not present

## 2019-07-23 DIAGNOSIS — S72002D Fracture of unspecified part of neck of left femur, subsequent encounter for closed fracture with routine healing: Secondary | ICD-10-CM | POA: Diagnosis not present

## 2019-07-23 DIAGNOSIS — E871 Hypo-osmolality and hyponatremia: Secondary | ICD-10-CM | POA: Diagnosis not present

## 2019-07-23 DIAGNOSIS — R339 Retention of urine, unspecified: Secondary | ICD-10-CM | POA: Diagnosis not present

## 2019-07-23 DIAGNOSIS — D696 Thrombocytopenia, unspecified: Secondary | ICD-10-CM | POA: Diagnosis not present

## 2019-07-24 DIAGNOSIS — R41841 Cognitive communication deficit: Secondary | ICD-10-CM | POA: Diagnosis not present

## 2019-07-24 DIAGNOSIS — S72002D Fracture of unspecified part of neck of left femur, subsequent encounter for closed fracture with routine healing: Secondary | ICD-10-CM | POA: Diagnosis not present

## 2019-07-24 DIAGNOSIS — D696 Thrombocytopenia, unspecified: Secondary | ICD-10-CM | POA: Diagnosis not present

## 2019-07-24 DIAGNOSIS — I10 Essential (primary) hypertension: Secondary | ICD-10-CM | POA: Diagnosis not present

## 2019-07-24 DIAGNOSIS — R339 Retention of urine, unspecified: Secondary | ICD-10-CM | POA: Diagnosis not present

## 2019-07-24 DIAGNOSIS — M9702XD Periprosthetic fracture around internal prosthetic left hip joint, subsequent encounter: Secondary | ICD-10-CM | POA: Diagnosis not present

## 2019-07-24 DIAGNOSIS — D500 Iron deficiency anemia secondary to blood loss (chronic): Secondary | ICD-10-CM | POA: Diagnosis not present

## 2019-07-24 DIAGNOSIS — E785 Hyperlipidemia, unspecified: Secondary | ICD-10-CM | POA: Diagnosis not present

## 2019-07-24 DIAGNOSIS — E871 Hypo-osmolality and hyponatremia: Secondary | ICD-10-CM | POA: Diagnosis not present

## 2019-07-25 DIAGNOSIS — E871 Hypo-osmolality and hyponatremia: Secondary | ICD-10-CM | POA: Diagnosis not present

## 2019-07-25 DIAGNOSIS — R41841 Cognitive communication deficit: Secondary | ICD-10-CM | POA: Diagnosis not present

## 2019-07-25 DIAGNOSIS — S72002D Fracture of unspecified part of neck of left femur, subsequent encounter for closed fracture with routine healing: Secondary | ICD-10-CM | POA: Diagnosis not present

## 2019-07-25 DIAGNOSIS — R339 Retention of urine, unspecified: Secondary | ICD-10-CM | POA: Diagnosis not present

## 2019-07-25 DIAGNOSIS — D500 Iron deficiency anemia secondary to blood loss (chronic): Secondary | ICD-10-CM | POA: Diagnosis not present

## 2019-07-25 DIAGNOSIS — M9702XD Periprosthetic fracture around internal prosthetic left hip joint, subsequent encounter: Secondary | ICD-10-CM | POA: Diagnosis not present

## 2019-07-25 DIAGNOSIS — I10 Essential (primary) hypertension: Secondary | ICD-10-CM | POA: Diagnosis not present

## 2019-07-25 DIAGNOSIS — E785 Hyperlipidemia, unspecified: Secondary | ICD-10-CM | POA: Diagnosis not present

## 2019-07-25 DIAGNOSIS — D696 Thrombocytopenia, unspecified: Secondary | ICD-10-CM | POA: Diagnosis not present

## 2019-07-26 DIAGNOSIS — D500 Iron deficiency anemia secondary to blood loss (chronic): Secondary | ICD-10-CM | POA: Diagnosis not present

## 2019-07-26 DIAGNOSIS — E871 Hypo-osmolality and hyponatremia: Secondary | ICD-10-CM | POA: Diagnosis not present

## 2019-07-26 DIAGNOSIS — M9702XD Periprosthetic fracture around internal prosthetic left hip joint, subsequent encounter: Secondary | ICD-10-CM | POA: Diagnosis not present

## 2019-07-26 DIAGNOSIS — R41841 Cognitive communication deficit: Secondary | ICD-10-CM | POA: Diagnosis not present

## 2019-07-26 DIAGNOSIS — D696 Thrombocytopenia, unspecified: Secondary | ICD-10-CM | POA: Diagnosis not present

## 2019-07-26 DIAGNOSIS — S72002D Fracture of unspecified part of neck of left femur, subsequent encounter for closed fracture with routine healing: Secondary | ICD-10-CM | POA: Diagnosis not present

## 2019-07-26 DIAGNOSIS — E785 Hyperlipidemia, unspecified: Secondary | ICD-10-CM | POA: Diagnosis not present

## 2019-07-26 DIAGNOSIS — I10 Essential (primary) hypertension: Secondary | ICD-10-CM | POA: Diagnosis not present

## 2019-07-26 DIAGNOSIS — R339 Retention of urine, unspecified: Secondary | ICD-10-CM | POA: Diagnosis not present

## 2019-07-30 DIAGNOSIS — S72002D Fracture of unspecified part of neck of left femur, subsequent encounter for closed fracture with routine healing: Secondary | ICD-10-CM | POA: Diagnosis not present

## 2019-07-30 DIAGNOSIS — E785 Hyperlipidemia, unspecified: Secondary | ICD-10-CM | POA: Diagnosis not present

## 2019-07-30 DIAGNOSIS — R41841 Cognitive communication deficit: Secondary | ICD-10-CM | POA: Diagnosis not present

## 2019-07-30 DIAGNOSIS — D500 Iron deficiency anemia secondary to blood loss (chronic): Secondary | ICD-10-CM | POA: Diagnosis not present

## 2019-07-30 DIAGNOSIS — I10 Essential (primary) hypertension: Secondary | ICD-10-CM | POA: Diagnosis not present

## 2019-07-30 DIAGNOSIS — E871 Hypo-osmolality and hyponatremia: Secondary | ICD-10-CM | POA: Diagnosis not present

## 2019-07-30 DIAGNOSIS — D696 Thrombocytopenia, unspecified: Secondary | ICD-10-CM | POA: Diagnosis not present

## 2019-07-30 DIAGNOSIS — R339 Retention of urine, unspecified: Secondary | ICD-10-CM | POA: Diagnosis not present

## 2019-07-30 DIAGNOSIS — M9702XD Periprosthetic fracture around internal prosthetic left hip joint, subsequent encounter: Secondary | ICD-10-CM | POA: Diagnosis not present

## 2019-08-01 DIAGNOSIS — R339 Retention of urine, unspecified: Secondary | ICD-10-CM | POA: Diagnosis not present

## 2019-08-01 DIAGNOSIS — D696 Thrombocytopenia, unspecified: Secondary | ICD-10-CM | POA: Diagnosis not present

## 2019-08-01 DIAGNOSIS — S72002D Fracture of unspecified part of neck of left femur, subsequent encounter for closed fracture with routine healing: Secondary | ICD-10-CM | POA: Diagnosis not present

## 2019-08-01 DIAGNOSIS — I10 Essential (primary) hypertension: Secondary | ICD-10-CM | POA: Diagnosis not present

## 2019-08-01 DIAGNOSIS — D500 Iron deficiency anemia secondary to blood loss (chronic): Secondary | ICD-10-CM | POA: Diagnosis not present

## 2019-08-01 DIAGNOSIS — E785 Hyperlipidemia, unspecified: Secondary | ICD-10-CM | POA: Diagnosis not present

## 2019-08-01 DIAGNOSIS — M9702XD Periprosthetic fracture around internal prosthetic left hip joint, subsequent encounter: Secondary | ICD-10-CM | POA: Diagnosis not present

## 2019-08-01 DIAGNOSIS — E871 Hypo-osmolality and hyponatremia: Secondary | ICD-10-CM | POA: Diagnosis not present

## 2019-08-01 DIAGNOSIS — R41841 Cognitive communication deficit: Secondary | ICD-10-CM | POA: Diagnosis not present

## 2019-08-02 DIAGNOSIS — R269 Unspecified abnormalities of gait and mobility: Secondary | ICD-10-CM | POA: Diagnosis not present

## 2019-08-02 DIAGNOSIS — I82403 Acute embolism and thrombosis of unspecified deep veins of lower extremity, bilateral: Secondary | ICD-10-CM | POA: Diagnosis not present

## 2019-08-02 DIAGNOSIS — R600 Localized edema: Secondary | ICD-10-CM | POA: Diagnosis not present

## 2019-08-02 DIAGNOSIS — S7292XD Unspecified fracture of left femur, subsequent encounter for closed fracture with routine healing: Secondary | ICD-10-CM | POA: Diagnosis not present

## 2019-08-02 DIAGNOSIS — I10 Essential (primary) hypertension: Secondary | ICD-10-CM | POA: Diagnosis not present

## 2019-08-03 DIAGNOSIS — E871 Hypo-osmolality and hyponatremia: Secondary | ICD-10-CM | POA: Diagnosis not present

## 2019-08-03 DIAGNOSIS — E785 Hyperlipidemia, unspecified: Secondary | ICD-10-CM | POA: Diagnosis not present

## 2019-08-03 DIAGNOSIS — R339 Retention of urine, unspecified: Secondary | ICD-10-CM | POA: Diagnosis not present

## 2019-08-03 DIAGNOSIS — R41841 Cognitive communication deficit: Secondary | ICD-10-CM | POA: Diagnosis not present

## 2019-08-03 DIAGNOSIS — D696 Thrombocytopenia, unspecified: Secondary | ICD-10-CM | POA: Diagnosis not present

## 2019-08-03 DIAGNOSIS — M9702XD Periprosthetic fracture around internal prosthetic left hip joint, subsequent encounter: Secondary | ICD-10-CM | POA: Diagnosis not present

## 2019-08-03 DIAGNOSIS — D500 Iron deficiency anemia secondary to blood loss (chronic): Secondary | ICD-10-CM | POA: Diagnosis not present

## 2019-08-03 DIAGNOSIS — I10 Essential (primary) hypertension: Secondary | ICD-10-CM | POA: Diagnosis not present

## 2019-08-03 DIAGNOSIS — S72002D Fracture of unspecified part of neck of left femur, subsequent encounter for closed fracture with routine healing: Secondary | ICD-10-CM | POA: Diagnosis not present

## 2019-08-06 DIAGNOSIS — D696 Thrombocytopenia, unspecified: Secondary | ICD-10-CM | POA: Diagnosis not present

## 2019-08-06 DIAGNOSIS — D500 Iron deficiency anemia secondary to blood loss (chronic): Secondary | ICD-10-CM | POA: Diagnosis not present

## 2019-08-06 DIAGNOSIS — E785 Hyperlipidemia, unspecified: Secondary | ICD-10-CM | POA: Diagnosis not present

## 2019-08-06 DIAGNOSIS — R339 Retention of urine, unspecified: Secondary | ICD-10-CM | POA: Diagnosis not present

## 2019-08-06 DIAGNOSIS — I10 Essential (primary) hypertension: Secondary | ICD-10-CM | POA: Diagnosis not present

## 2019-08-06 DIAGNOSIS — S72002D Fracture of unspecified part of neck of left femur, subsequent encounter for closed fracture with routine healing: Secondary | ICD-10-CM | POA: Diagnosis not present

## 2019-08-06 DIAGNOSIS — M9702XD Periprosthetic fracture around internal prosthetic left hip joint, subsequent encounter: Secondary | ICD-10-CM | POA: Diagnosis not present

## 2019-08-06 DIAGNOSIS — E871 Hypo-osmolality and hyponatremia: Secondary | ICD-10-CM | POA: Diagnosis not present

## 2019-08-06 DIAGNOSIS — R41841 Cognitive communication deficit: Secondary | ICD-10-CM | POA: Diagnosis not present

## 2019-08-07 DIAGNOSIS — R41841 Cognitive communication deficit: Secondary | ICD-10-CM | POA: Diagnosis not present

## 2019-08-07 DIAGNOSIS — E871 Hypo-osmolality and hyponatremia: Secondary | ICD-10-CM | POA: Diagnosis not present

## 2019-08-07 DIAGNOSIS — S72002D Fracture of unspecified part of neck of left femur, subsequent encounter for closed fracture with routine healing: Secondary | ICD-10-CM | POA: Diagnosis not present

## 2019-08-07 DIAGNOSIS — D696 Thrombocytopenia, unspecified: Secondary | ICD-10-CM | POA: Diagnosis not present

## 2019-08-07 DIAGNOSIS — D500 Iron deficiency anemia secondary to blood loss (chronic): Secondary | ICD-10-CM | POA: Diagnosis not present

## 2019-08-07 DIAGNOSIS — M9702XD Periprosthetic fracture around internal prosthetic left hip joint, subsequent encounter: Secondary | ICD-10-CM | POA: Diagnosis not present

## 2019-08-07 DIAGNOSIS — R339 Retention of urine, unspecified: Secondary | ICD-10-CM | POA: Diagnosis not present

## 2019-08-07 DIAGNOSIS — I10 Essential (primary) hypertension: Secondary | ICD-10-CM | POA: Diagnosis not present

## 2019-08-07 DIAGNOSIS — E785 Hyperlipidemia, unspecified: Secondary | ICD-10-CM | POA: Diagnosis not present

## 2019-08-08 DIAGNOSIS — S72002D Fracture of unspecified part of neck of left femur, subsequent encounter for closed fracture with routine healing: Secondary | ICD-10-CM | POA: Diagnosis not present

## 2019-08-08 DIAGNOSIS — R339 Retention of urine, unspecified: Secondary | ICD-10-CM | POA: Diagnosis not present

## 2019-08-08 DIAGNOSIS — I10 Essential (primary) hypertension: Secondary | ICD-10-CM | POA: Diagnosis not present

## 2019-08-08 DIAGNOSIS — M9702XD Periprosthetic fracture around internal prosthetic left hip joint, subsequent encounter: Secondary | ICD-10-CM | POA: Diagnosis not present

## 2019-08-08 DIAGNOSIS — E871 Hypo-osmolality and hyponatremia: Secondary | ICD-10-CM | POA: Diagnosis not present

## 2019-08-08 DIAGNOSIS — R41841 Cognitive communication deficit: Secondary | ICD-10-CM | POA: Diagnosis not present

## 2019-08-08 DIAGNOSIS — D696 Thrombocytopenia, unspecified: Secondary | ICD-10-CM | POA: Diagnosis not present

## 2019-08-08 DIAGNOSIS — D500 Iron deficiency anemia secondary to blood loss (chronic): Secondary | ICD-10-CM | POA: Diagnosis not present

## 2019-08-08 DIAGNOSIS — E785 Hyperlipidemia, unspecified: Secondary | ICD-10-CM | POA: Diagnosis not present

## 2019-08-12 DIAGNOSIS — D500 Iron deficiency anemia secondary to blood loss (chronic): Secondary | ICD-10-CM | POA: Diagnosis not present

## 2019-08-12 DIAGNOSIS — R41841 Cognitive communication deficit: Secondary | ICD-10-CM | POA: Diagnosis not present

## 2019-08-12 DIAGNOSIS — M9702XD Periprosthetic fracture around internal prosthetic left hip joint, subsequent encounter: Secondary | ICD-10-CM | POA: Diagnosis not present

## 2019-08-12 DIAGNOSIS — R339 Retention of urine, unspecified: Secondary | ICD-10-CM | POA: Diagnosis not present

## 2019-08-12 DIAGNOSIS — D696 Thrombocytopenia, unspecified: Secondary | ICD-10-CM | POA: Diagnosis not present

## 2019-08-12 DIAGNOSIS — S72002D Fracture of unspecified part of neck of left femur, subsequent encounter for closed fracture with routine healing: Secondary | ICD-10-CM | POA: Diagnosis not present

## 2019-08-12 DIAGNOSIS — E785 Hyperlipidemia, unspecified: Secondary | ICD-10-CM | POA: Diagnosis not present

## 2019-08-12 DIAGNOSIS — E871 Hypo-osmolality and hyponatremia: Secondary | ICD-10-CM | POA: Diagnosis not present

## 2019-08-12 DIAGNOSIS — I10 Essential (primary) hypertension: Secondary | ICD-10-CM | POA: Diagnosis not present

## 2019-08-13 DIAGNOSIS — S7292XD Unspecified fracture of left femur, subsequent encounter for closed fracture with routine healing: Secondary | ICD-10-CM | POA: Diagnosis not present

## 2019-08-13 DIAGNOSIS — M8000XD Age-related osteoporosis with current pathological fracture, unspecified site, subsequent encounter for fracture with routine healing: Secondary | ICD-10-CM | POA: Diagnosis not present

## 2019-08-13 DIAGNOSIS — I10 Essential (primary) hypertension: Secondary | ICD-10-CM | POA: Diagnosis not present

## 2019-08-13 DIAGNOSIS — F5101 Primary insomnia: Secondary | ICD-10-CM | POA: Diagnosis not present

## 2019-08-15 DIAGNOSIS — E871 Hypo-osmolality and hyponatremia: Secondary | ICD-10-CM | POA: Diagnosis not present

## 2019-08-15 DIAGNOSIS — S72002D Fracture of unspecified part of neck of left femur, subsequent encounter for closed fracture with routine healing: Secondary | ICD-10-CM | POA: Diagnosis not present

## 2019-08-15 DIAGNOSIS — D696 Thrombocytopenia, unspecified: Secondary | ICD-10-CM | POA: Diagnosis not present

## 2019-08-15 DIAGNOSIS — R41841 Cognitive communication deficit: Secondary | ICD-10-CM | POA: Diagnosis not present

## 2019-08-15 DIAGNOSIS — I10 Essential (primary) hypertension: Secondary | ICD-10-CM | POA: Diagnosis not present

## 2019-08-15 DIAGNOSIS — R339 Retention of urine, unspecified: Secondary | ICD-10-CM | POA: Diagnosis not present

## 2019-08-15 DIAGNOSIS — M9702XD Periprosthetic fracture around internal prosthetic left hip joint, subsequent encounter: Secondary | ICD-10-CM | POA: Diagnosis not present

## 2019-08-15 DIAGNOSIS — E785 Hyperlipidemia, unspecified: Secondary | ICD-10-CM | POA: Diagnosis not present

## 2019-08-15 DIAGNOSIS — D500 Iron deficiency anemia secondary to blood loss (chronic): Secondary | ICD-10-CM | POA: Diagnosis not present

## 2019-08-16 DIAGNOSIS — R1310 Dysphagia, unspecified: Secondary | ICD-10-CM | POA: Diagnosis not present

## 2019-08-16 DIAGNOSIS — I10 Essential (primary) hypertension: Secondary | ICD-10-CM | POA: Diagnosis not present

## 2019-08-18 DIAGNOSIS — M9702XD Periprosthetic fracture around internal prosthetic left hip joint, subsequent encounter: Secondary | ICD-10-CM | POA: Diagnosis not present

## 2019-08-20 DIAGNOSIS — R2681 Unsteadiness on feet: Secondary | ICD-10-CM | POA: Diagnosis not present

## 2019-08-20 DIAGNOSIS — R296 Repeated falls: Secondary | ICD-10-CM | POA: Diagnosis not present

## 2019-08-20 DIAGNOSIS — M6281 Muscle weakness (generalized): Secondary | ICD-10-CM | POA: Diagnosis not present

## 2019-08-23 DIAGNOSIS — R2681 Unsteadiness on feet: Secondary | ICD-10-CM | POA: Diagnosis not present

## 2019-08-23 DIAGNOSIS — M6281 Muscle weakness (generalized): Secondary | ICD-10-CM | POA: Diagnosis not present

## 2019-08-23 DIAGNOSIS — R296 Repeated falls: Secondary | ICD-10-CM | POA: Diagnosis not present

## 2019-08-24 DIAGNOSIS — R296 Repeated falls: Secondary | ICD-10-CM | POA: Diagnosis not present

## 2019-08-24 DIAGNOSIS — R2681 Unsteadiness on feet: Secondary | ICD-10-CM | POA: Diagnosis not present

## 2019-08-24 DIAGNOSIS — M6281 Muscle weakness (generalized): Secondary | ICD-10-CM | POA: Diagnosis not present

## 2019-08-26 DIAGNOSIS — R2681 Unsteadiness on feet: Secondary | ICD-10-CM | POA: Diagnosis not present

## 2019-08-26 DIAGNOSIS — R296 Repeated falls: Secondary | ICD-10-CM | POA: Diagnosis not present

## 2019-08-26 DIAGNOSIS — M6281 Muscle weakness (generalized): Secondary | ICD-10-CM | POA: Diagnosis not present

## 2019-08-29 DIAGNOSIS — M9702XA Periprosthetic fracture around internal prosthetic left hip joint, initial encounter: Secondary | ICD-10-CM | POA: Diagnosis not present

## 2019-08-29 DIAGNOSIS — M9702XD Periprosthetic fracture around internal prosthetic left hip joint, subsequent encounter: Secondary | ICD-10-CM | POA: Diagnosis not present

## 2019-08-30 DIAGNOSIS — R2681 Unsteadiness on feet: Secondary | ICD-10-CM | POA: Diagnosis not present

## 2019-08-30 DIAGNOSIS — R296 Repeated falls: Secondary | ICD-10-CM | POA: Diagnosis not present

## 2019-08-30 DIAGNOSIS — M6281 Muscle weakness (generalized): Secondary | ICD-10-CM | POA: Diagnosis not present

## 2019-08-31 DIAGNOSIS — M6281 Muscle weakness (generalized): Secondary | ICD-10-CM | POA: Diagnosis not present

## 2019-08-31 DIAGNOSIS — R296 Repeated falls: Secondary | ICD-10-CM | POA: Diagnosis not present

## 2019-08-31 DIAGNOSIS — R2681 Unsteadiness on feet: Secondary | ICD-10-CM | POA: Diagnosis not present

## 2019-09-03 DIAGNOSIS — M6281 Muscle weakness (generalized): Secondary | ICD-10-CM | POA: Diagnosis not present

## 2019-09-03 DIAGNOSIS — R2681 Unsteadiness on feet: Secondary | ICD-10-CM | POA: Diagnosis not present

## 2019-09-03 DIAGNOSIS — R296 Repeated falls: Secondary | ICD-10-CM | POA: Diagnosis not present

## 2019-09-05 DIAGNOSIS — R2681 Unsteadiness on feet: Secondary | ICD-10-CM | POA: Diagnosis not present

## 2019-09-05 DIAGNOSIS — R296 Repeated falls: Secondary | ICD-10-CM | POA: Diagnosis not present

## 2019-09-05 DIAGNOSIS — M6281 Muscle weakness (generalized): Secondary | ICD-10-CM | POA: Diagnosis not present

## 2019-09-06 DIAGNOSIS — R2681 Unsteadiness on feet: Secondary | ICD-10-CM | POA: Diagnosis not present

## 2019-09-06 DIAGNOSIS — I10 Essential (primary) hypertension: Secondary | ICD-10-CM | POA: Diagnosis not present

## 2019-09-06 DIAGNOSIS — R109 Unspecified abdominal pain: Secondary | ICD-10-CM | POA: Diagnosis not present

## 2019-09-06 DIAGNOSIS — K5901 Slow transit constipation: Secondary | ICD-10-CM | POA: Diagnosis not present

## 2019-09-06 DIAGNOSIS — R296 Repeated falls: Secondary | ICD-10-CM | POA: Diagnosis not present

## 2019-09-06 DIAGNOSIS — M6281 Muscle weakness (generalized): Secondary | ICD-10-CM | POA: Diagnosis not present

## 2019-09-07 DIAGNOSIS — M6281 Muscle weakness (generalized): Secondary | ICD-10-CM | POA: Diagnosis not present

## 2019-09-07 DIAGNOSIS — R2681 Unsteadiness on feet: Secondary | ICD-10-CM | POA: Diagnosis not present

## 2019-09-07 DIAGNOSIS — R296 Repeated falls: Secondary | ICD-10-CM | POA: Diagnosis not present

## 2019-09-10 DIAGNOSIS — R2681 Unsteadiness on feet: Secondary | ICD-10-CM | POA: Diagnosis not present

## 2019-09-10 DIAGNOSIS — R296 Repeated falls: Secondary | ICD-10-CM | POA: Diagnosis not present

## 2019-09-10 DIAGNOSIS — M6281 Muscle weakness (generalized): Secondary | ICD-10-CM | POA: Diagnosis not present

## 2019-09-11 DIAGNOSIS — R2681 Unsteadiness on feet: Secondary | ICD-10-CM | POA: Diagnosis not present

## 2019-09-11 DIAGNOSIS — M6281 Muscle weakness (generalized): Secondary | ICD-10-CM | POA: Diagnosis not present

## 2019-09-11 DIAGNOSIS — R296 Repeated falls: Secondary | ICD-10-CM | POA: Diagnosis not present

## 2019-09-12 DIAGNOSIS — R2681 Unsteadiness on feet: Secondary | ICD-10-CM | POA: Diagnosis not present

## 2019-09-12 DIAGNOSIS — R296 Repeated falls: Secondary | ICD-10-CM | POA: Diagnosis not present

## 2019-09-12 DIAGNOSIS — M6281 Muscle weakness (generalized): Secondary | ICD-10-CM | POA: Diagnosis not present

## 2019-09-13 DIAGNOSIS — M6281 Muscle weakness (generalized): Secondary | ICD-10-CM | POA: Diagnosis not present

## 2019-09-13 DIAGNOSIS — R2681 Unsteadiness on feet: Secondary | ICD-10-CM | POA: Diagnosis not present

## 2019-09-13 DIAGNOSIS — R296 Repeated falls: Secondary | ICD-10-CM | POA: Diagnosis not present

## 2019-09-14 DIAGNOSIS — R296 Repeated falls: Secondary | ICD-10-CM | POA: Diagnosis not present

## 2019-09-14 DIAGNOSIS — M6281 Muscle weakness (generalized): Secondary | ICD-10-CM | POA: Diagnosis not present

## 2019-09-14 DIAGNOSIS — R2681 Unsteadiness on feet: Secondary | ICD-10-CM | POA: Diagnosis not present

## 2019-09-17 DIAGNOSIS — R2681 Unsteadiness on feet: Secondary | ICD-10-CM | POA: Diagnosis not present

## 2019-09-17 DIAGNOSIS — I10 Essential (primary) hypertension: Secondary | ICD-10-CM | POA: Diagnosis not present

## 2019-09-17 DIAGNOSIS — S7292XD Unspecified fracture of left femur, subsequent encounter for closed fracture with routine healing: Secondary | ICD-10-CM | POA: Diagnosis not present

## 2019-09-17 DIAGNOSIS — R296 Repeated falls: Secondary | ICD-10-CM | POA: Diagnosis not present

## 2019-09-17 DIAGNOSIS — M9702XD Periprosthetic fracture around internal prosthetic left hip joint, subsequent encounter: Secondary | ICD-10-CM | POA: Diagnosis not present

## 2019-09-17 DIAGNOSIS — M8000XD Age-related osteoporosis with current pathological fracture, unspecified site, subsequent encounter for fracture with routine healing: Secondary | ICD-10-CM | POA: Diagnosis not present

## 2019-09-17 DIAGNOSIS — F5101 Primary insomnia: Secondary | ICD-10-CM | POA: Diagnosis not present

## 2019-09-17 DIAGNOSIS — M6281 Muscle weakness (generalized): Secondary | ICD-10-CM | POA: Diagnosis not present

## 2019-09-18 DIAGNOSIS — R296 Repeated falls: Secondary | ICD-10-CM | POA: Diagnosis not present

## 2019-09-18 DIAGNOSIS — R2681 Unsteadiness on feet: Secondary | ICD-10-CM | POA: Diagnosis not present

## 2019-09-18 DIAGNOSIS — M6281 Muscle weakness (generalized): Secondary | ICD-10-CM | POA: Diagnosis not present

## 2019-09-19 DIAGNOSIS — R2681 Unsteadiness on feet: Secondary | ICD-10-CM | POA: Diagnosis not present

## 2019-09-19 DIAGNOSIS — R296 Repeated falls: Secondary | ICD-10-CM | POA: Diagnosis not present

## 2019-09-19 DIAGNOSIS — M6281 Muscle weakness (generalized): Secondary | ICD-10-CM | POA: Diagnosis not present

## 2019-09-21 DIAGNOSIS — M6281 Muscle weakness (generalized): Secondary | ICD-10-CM | POA: Diagnosis not present

## 2019-09-21 DIAGNOSIS — R2681 Unsteadiness on feet: Secondary | ICD-10-CM | POA: Diagnosis not present

## 2019-09-21 DIAGNOSIS — R296 Repeated falls: Secondary | ICD-10-CM | POA: Diagnosis not present

## 2019-09-24 DIAGNOSIS — R2681 Unsteadiness on feet: Secondary | ICD-10-CM | POA: Diagnosis not present

## 2019-09-24 DIAGNOSIS — R296 Repeated falls: Secondary | ICD-10-CM | POA: Diagnosis not present

## 2019-09-24 DIAGNOSIS — M6281 Muscle weakness (generalized): Secondary | ICD-10-CM | POA: Diagnosis not present

## 2019-09-25 DIAGNOSIS — R296 Repeated falls: Secondary | ICD-10-CM | POA: Diagnosis not present

## 2019-09-25 DIAGNOSIS — R2681 Unsteadiness on feet: Secondary | ICD-10-CM | POA: Diagnosis not present

## 2019-09-25 DIAGNOSIS — M6281 Muscle weakness (generalized): Secondary | ICD-10-CM | POA: Diagnosis not present

## 2019-09-26 DIAGNOSIS — R2681 Unsteadiness on feet: Secondary | ICD-10-CM | POA: Diagnosis not present

## 2019-09-26 DIAGNOSIS — R296 Repeated falls: Secondary | ICD-10-CM | POA: Diagnosis not present

## 2019-09-26 DIAGNOSIS — M6281 Muscle weakness (generalized): Secondary | ICD-10-CM | POA: Diagnosis not present

## 2019-09-27 DIAGNOSIS — R2681 Unsteadiness on feet: Secondary | ICD-10-CM | POA: Diagnosis not present

## 2019-09-27 DIAGNOSIS — M6281 Muscle weakness (generalized): Secondary | ICD-10-CM | POA: Diagnosis not present

## 2019-09-27 DIAGNOSIS — R296 Repeated falls: Secondary | ICD-10-CM | POA: Diagnosis not present

## 2019-09-28 DIAGNOSIS — R2681 Unsteadiness on feet: Secondary | ICD-10-CM | POA: Diagnosis not present

## 2019-09-28 DIAGNOSIS — M6281 Muscle weakness (generalized): Secondary | ICD-10-CM | POA: Diagnosis not present

## 2019-09-28 DIAGNOSIS — R296 Repeated falls: Secondary | ICD-10-CM | POA: Diagnosis not present

## 2019-10-01 DIAGNOSIS — R296 Repeated falls: Secondary | ICD-10-CM | POA: Diagnosis not present

## 2019-10-01 DIAGNOSIS — M6281 Muscle weakness (generalized): Secondary | ICD-10-CM | POA: Diagnosis not present

## 2019-10-01 DIAGNOSIS — R2681 Unsteadiness on feet: Secondary | ICD-10-CM | POA: Diagnosis not present

## 2019-10-02 DIAGNOSIS — M6281 Muscle weakness (generalized): Secondary | ICD-10-CM | POA: Diagnosis not present

## 2019-10-02 DIAGNOSIS — R2681 Unsteadiness on feet: Secondary | ICD-10-CM | POA: Diagnosis not present

## 2019-10-02 DIAGNOSIS — R296 Repeated falls: Secondary | ICD-10-CM | POA: Diagnosis not present

## 2019-10-03 DIAGNOSIS — M6281 Muscle weakness (generalized): Secondary | ICD-10-CM | POA: Diagnosis not present

## 2019-10-03 DIAGNOSIS — R2681 Unsteadiness on feet: Secondary | ICD-10-CM | POA: Diagnosis not present

## 2019-10-03 DIAGNOSIS — R296 Repeated falls: Secondary | ICD-10-CM | POA: Diagnosis not present

## 2019-10-04 DIAGNOSIS — R2681 Unsteadiness on feet: Secondary | ICD-10-CM | POA: Diagnosis not present

## 2019-10-04 DIAGNOSIS — I10 Essential (primary) hypertension: Secondary | ICD-10-CM | POA: Diagnosis not present

## 2019-10-04 DIAGNOSIS — R296 Repeated falls: Secondary | ICD-10-CM | POA: Diagnosis not present

## 2019-10-04 DIAGNOSIS — M6281 Muscle weakness (generalized): Secondary | ICD-10-CM | POA: Diagnosis not present

## 2019-10-05 DIAGNOSIS — R2681 Unsteadiness on feet: Secondary | ICD-10-CM | POA: Diagnosis not present

## 2019-10-05 DIAGNOSIS — M6281 Muscle weakness (generalized): Secondary | ICD-10-CM | POA: Diagnosis not present

## 2019-10-05 DIAGNOSIS — R296 Repeated falls: Secondary | ICD-10-CM | POA: Diagnosis not present

## 2019-10-08 DIAGNOSIS — R296 Repeated falls: Secondary | ICD-10-CM | POA: Diagnosis not present

## 2019-10-08 DIAGNOSIS — R2681 Unsteadiness on feet: Secondary | ICD-10-CM | POA: Diagnosis not present

## 2019-10-08 DIAGNOSIS — M6281 Muscle weakness (generalized): Secondary | ICD-10-CM | POA: Diagnosis not present

## 2019-10-09 DIAGNOSIS — R2681 Unsteadiness on feet: Secondary | ICD-10-CM | POA: Diagnosis not present

## 2019-10-09 DIAGNOSIS — M6281 Muscle weakness (generalized): Secondary | ICD-10-CM | POA: Diagnosis not present

## 2019-10-09 DIAGNOSIS — R296 Repeated falls: Secondary | ICD-10-CM | POA: Diagnosis not present

## 2019-10-10 DIAGNOSIS — R2681 Unsteadiness on feet: Secondary | ICD-10-CM | POA: Diagnosis not present

## 2019-10-10 DIAGNOSIS — M6281 Muscle weakness (generalized): Secondary | ICD-10-CM | POA: Diagnosis not present

## 2019-10-10 DIAGNOSIS — R296 Repeated falls: Secondary | ICD-10-CM | POA: Diagnosis not present

## 2019-10-11 DIAGNOSIS — R296 Repeated falls: Secondary | ICD-10-CM | POA: Diagnosis not present

## 2019-10-11 DIAGNOSIS — M6281 Muscle weakness (generalized): Secondary | ICD-10-CM | POA: Diagnosis not present

## 2019-10-11 DIAGNOSIS — R2681 Unsteadiness on feet: Secondary | ICD-10-CM | POA: Diagnosis not present

## 2019-10-12 DIAGNOSIS — M6281 Muscle weakness (generalized): Secondary | ICD-10-CM | POA: Diagnosis not present

## 2019-10-12 DIAGNOSIS — R2681 Unsteadiness on feet: Secondary | ICD-10-CM | POA: Diagnosis not present

## 2019-10-12 DIAGNOSIS — R296 Repeated falls: Secondary | ICD-10-CM | POA: Diagnosis not present

## 2019-10-15 DIAGNOSIS — I739 Peripheral vascular disease, unspecified: Secondary | ICD-10-CM | POA: Diagnosis not present

## 2019-10-15 DIAGNOSIS — B351 Tinea unguium: Secondary | ICD-10-CM | POA: Diagnosis not present

## 2019-10-15 DIAGNOSIS — L84 Corns and callosities: Secondary | ICD-10-CM | POA: Diagnosis not present

## 2019-10-16 DIAGNOSIS — R296 Repeated falls: Secondary | ICD-10-CM | POA: Diagnosis not present

## 2019-10-16 DIAGNOSIS — M6281 Muscle weakness (generalized): Secondary | ICD-10-CM | POA: Diagnosis not present

## 2019-10-16 DIAGNOSIS — R2681 Unsteadiness on feet: Secondary | ICD-10-CM | POA: Diagnosis not present

## 2019-10-18 DIAGNOSIS — R2681 Unsteadiness on feet: Secondary | ICD-10-CM | POA: Diagnosis not present

## 2019-10-18 DIAGNOSIS — R296 Repeated falls: Secondary | ICD-10-CM | POA: Diagnosis not present

## 2019-10-18 DIAGNOSIS — M6281 Muscle weakness (generalized): Secondary | ICD-10-CM | POA: Diagnosis not present

## 2019-10-19 DIAGNOSIS — M6281 Muscle weakness (generalized): Secondary | ICD-10-CM | POA: Diagnosis not present

## 2019-10-19 DIAGNOSIS — R2681 Unsteadiness on feet: Secondary | ICD-10-CM | POA: Diagnosis not present

## 2019-10-19 DIAGNOSIS — R296 Repeated falls: Secondary | ICD-10-CM | POA: Diagnosis not present

## 2019-10-22 DIAGNOSIS — M6281 Muscle weakness (generalized): Secondary | ICD-10-CM | POA: Diagnosis not present

## 2019-10-22 DIAGNOSIS — R2681 Unsteadiness on feet: Secondary | ICD-10-CM | POA: Diagnosis not present

## 2019-10-22 DIAGNOSIS — R296 Repeated falls: Secondary | ICD-10-CM | POA: Diagnosis not present

## 2019-10-24 DIAGNOSIS — R296 Repeated falls: Secondary | ICD-10-CM | POA: Diagnosis not present

## 2019-10-24 DIAGNOSIS — R2681 Unsteadiness on feet: Secondary | ICD-10-CM | POA: Diagnosis not present

## 2019-10-24 DIAGNOSIS — M6281 Muscle weakness (generalized): Secondary | ICD-10-CM | POA: Diagnosis not present

## 2019-10-25 DIAGNOSIS — R2681 Unsteadiness on feet: Secondary | ICD-10-CM | POA: Diagnosis not present

## 2019-10-25 DIAGNOSIS — R296 Repeated falls: Secondary | ICD-10-CM | POA: Diagnosis not present

## 2019-10-25 DIAGNOSIS — M6281 Muscle weakness (generalized): Secondary | ICD-10-CM | POA: Diagnosis not present

## 2019-10-26 DIAGNOSIS — R296 Repeated falls: Secondary | ICD-10-CM | POA: Diagnosis not present

## 2019-10-26 DIAGNOSIS — M6281 Muscle weakness (generalized): Secondary | ICD-10-CM | POA: Diagnosis not present

## 2019-10-26 DIAGNOSIS — R2681 Unsteadiness on feet: Secondary | ICD-10-CM | POA: Diagnosis not present

## 2019-10-30 DIAGNOSIS — M6281 Muscle weakness (generalized): Secondary | ICD-10-CM | POA: Diagnosis not present

## 2019-10-30 DIAGNOSIS — R296 Repeated falls: Secondary | ICD-10-CM | POA: Diagnosis not present

## 2019-10-30 DIAGNOSIS — R2681 Unsteadiness on feet: Secondary | ICD-10-CM | POA: Diagnosis not present

## 2019-10-31 DIAGNOSIS — M6281 Muscle weakness (generalized): Secondary | ICD-10-CM | POA: Diagnosis not present

## 2019-10-31 DIAGNOSIS — R2681 Unsteadiness on feet: Secondary | ICD-10-CM | POA: Diagnosis not present

## 2019-10-31 DIAGNOSIS — R296 Repeated falls: Secondary | ICD-10-CM | POA: Diagnosis not present

## 2019-11-01 DIAGNOSIS — M6281 Muscle weakness (generalized): Secondary | ICD-10-CM | POA: Diagnosis not present

## 2019-11-01 DIAGNOSIS — R2681 Unsteadiness on feet: Secondary | ICD-10-CM | POA: Diagnosis not present

## 2019-11-01 DIAGNOSIS — R296 Repeated falls: Secondary | ICD-10-CM | POA: Diagnosis not present

## 2019-11-02 DIAGNOSIS — M6281 Muscle weakness (generalized): Secondary | ICD-10-CM | POA: Diagnosis not present

## 2019-11-02 DIAGNOSIS — R296 Repeated falls: Secondary | ICD-10-CM | POA: Diagnosis not present

## 2019-11-02 DIAGNOSIS — R2681 Unsteadiness on feet: Secondary | ICD-10-CM | POA: Diagnosis not present

## 2019-11-05 DIAGNOSIS — M6281 Muscle weakness (generalized): Secondary | ICD-10-CM | POA: Diagnosis not present

## 2019-11-05 DIAGNOSIS — R2681 Unsteadiness on feet: Secondary | ICD-10-CM | POA: Diagnosis not present

## 2019-11-05 DIAGNOSIS — R296 Repeated falls: Secondary | ICD-10-CM | POA: Diagnosis not present

## 2019-11-06 DIAGNOSIS — M6281 Muscle weakness (generalized): Secondary | ICD-10-CM | POA: Diagnosis not present

## 2019-11-06 DIAGNOSIS — R2681 Unsteadiness on feet: Secondary | ICD-10-CM | POA: Diagnosis not present

## 2019-11-06 DIAGNOSIS — R296 Repeated falls: Secondary | ICD-10-CM | POA: Diagnosis not present

## 2019-11-07 DIAGNOSIS — M6281 Muscle weakness (generalized): Secondary | ICD-10-CM | POA: Diagnosis not present

## 2019-11-07 DIAGNOSIS — R2681 Unsteadiness on feet: Secondary | ICD-10-CM | POA: Diagnosis not present

## 2019-11-07 DIAGNOSIS — R296 Repeated falls: Secondary | ICD-10-CM | POA: Diagnosis not present

## 2019-11-09 DIAGNOSIS — R296 Repeated falls: Secondary | ICD-10-CM | POA: Diagnosis not present

## 2019-11-09 DIAGNOSIS — M6281 Muscle weakness (generalized): Secondary | ICD-10-CM | POA: Diagnosis not present

## 2019-11-09 DIAGNOSIS — R2681 Unsteadiness on feet: Secondary | ICD-10-CM | POA: Diagnosis not present

## 2019-11-12 DIAGNOSIS — M6281 Muscle weakness (generalized): Secondary | ICD-10-CM | POA: Diagnosis not present

## 2019-11-12 DIAGNOSIS — R296 Repeated falls: Secondary | ICD-10-CM | POA: Diagnosis not present

## 2019-11-12 DIAGNOSIS — R2681 Unsteadiness on feet: Secondary | ICD-10-CM | POA: Diagnosis not present

## 2019-11-14 DIAGNOSIS — R2681 Unsteadiness on feet: Secondary | ICD-10-CM | POA: Diagnosis not present

## 2019-11-14 DIAGNOSIS — R296 Repeated falls: Secondary | ICD-10-CM | POA: Diagnosis not present

## 2019-11-14 DIAGNOSIS — M6281 Muscle weakness (generalized): Secondary | ICD-10-CM | POA: Diagnosis not present

## 2019-11-15 DIAGNOSIS — R2681 Unsteadiness on feet: Secondary | ICD-10-CM | POA: Diagnosis not present

## 2019-11-15 DIAGNOSIS — R5383 Other fatigue: Secondary | ICD-10-CM | POA: Diagnosis not present

## 2019-11-15 DIAGNOSIS — D509 Iron deficiency anemia, unspecified: Secondary | ICD-10-CM | POA: Diagnosis not present

## 2019-11-15 DIAGNOSIS — M6281 Muscle weakness (generalized): Secondary | ICD-10-CM | POA: Diagnosis not present

## 2019-11-15 DIAGNOSIS — R296 Repeated falls: Secondary | ICD-10-CM | POA: Diagnosis not present

## 2019-11-15 DIAGNOSIS — I10 Essential (primary) hypertension: Secondary | ICD-10-CM | POA: Diagnosis not present

## 2019-11-16 DIAGNOSIS — M6281 Muscle weakness (generalized): Secondary | ICD-10-CM | POA: Diagnosis not present

## 2019-11-16 DIAGNOSIS — R296 Repeated falls: Secondary | ICD-10-CM | POA: Diagnosis not present

## 2019-11-16 DIAGNOSIS — R2681 Unsteadiness on feet: Secondary | ICD-10-CM | POA: Diagnosis not present

## 2019-11-19 DIAGNOSIS — R296 Repeated falls: Secondary | ICD-10-CM | POA: Diagnosis not present

## 2019-11-19 DIAGNOSIS — R2681 Unsteadiness on feet: Secondary | ICD-10-CM | POA: Diagnosis not present

## 2019-11-19 DIAGNOSIS — M6281 Muscle weakness (generalized): Secondary | ICD-10-CM | POA: Diagnosis not present

## 2019-11-20 DIAGNOSIS — H04123 Dry eye syndrome of bilateral lacrimal glands: Secondary | ICD-10-CM | POA: Diagnosis not present

## 2019-11-21 DIAGNOSIS — R2681 Unsteadiness on feet: Secondary | ICD-10-CM | POA: Diagnosis not present

## 2019-11-21 DIAGNOSIS — R296 Repeated falls: Secondary | ICD-10-CM | POA: Diagnosis not present

## 2019-11-21 DIAGNOSIS — M6281 Muscle weakness (generalized): Secondary | ICD-10-CM | POA: Diagnosis not present

## 2019-11-26 DIAGNOSIS — M6281 Muscle weakness (generalized): Secondary | ICD-10-CM | POA: Diagnosis not present

## 2019-11-26 DIAGNOSIS — R2681 Unsteadiness on feet: Secondary | ICD-10-CM | POA: Diagnosis not present

## 2019-11-26 DIAGNOSIS — R296 Repeated falls: Secondary | ICD-10-CM | POA: Diagnosis not present

## 2019-11-28 DIAGNOSIS — R2681 Unsteadiness on feet: Secondary | ICD-10-CM | POA: Diagnosis not present

## 2019-11-28 DIAGNOSIS — M6281 Muscle weakness (generalized): Secondary | ICD-10-CM | POA: Diagnosis not present

## 2019-11-28 DIAGNOSIS — R296 Repeated falls: Secondary | ICD-10-CM | POA: Diagnosis not present

## 2019-11-30 DIAGNOSIS — M6281 Muscle weakness (generalized): Secondary | ICD-10-CM | POA: Diagnosis not present

## 2019-11-30 DIAGNOSIS — R2681 Unsteadiness on feet: Secondary | ICD-10-CM | POA: Diagnosis not present

## 2019-12-03 DIAGNOSIS — R2681 Unsteadiness on feet: Secondary | ICD-10-CM | POA: Diagnosis not present

## 2019-12-03 DIAGNOSIS — M6281 Muscle weakness (generalized): Secondary | ICD-10-CM | POA: Diagnosis not present

## 2019-12-05 DIAGNOSIS — M6281 Muscle weakness (generalized): Secondary | ICD-10-CM | POA: Diagnosis not present

## 2019-12-05 DIAGNOSIS — R2681 Unsteadiness on feet: Secondary | ICD-10-CM | POA: Diagnosis not present

## 2019-12-06 DIAGNOSIS — R5383 Other fatigue: Secondary | ICD-10-CM | POA: Diagnosis not present

## 2019-12-07 DIAGNOSIS — R2681 Unsteadiness on feet: Secondary | ICD-10-CM | POA: Diagnosis not present

## 2019-12-07 DIAGNOSIS — M6281 Muscle weakness (generalized): Secondary | ICD-10-CM | POA: Diagnosis not present

## 2019-12-10 DIAGNOSIS — M6281 Muscle weakness (generalized): Secondary | ICD-10-CM | POA: Diagnosis not present

## 2019-12-10 DIAGNOSIS — R2681 Unsteadiness on feet: Secondary | ICD-10-CM | POA: Diagnosis not present

## 2019-12-11 DIAGNOSIS — R2681 Unsteadiness on feet: Secondary | ICD-10-CM | POA: Diagnosis not present

## 2019-12-11 DIAGNOSIS — M6281 Muscle weakness (generalized): Secondary | ICD-10-CM | POA: Diagnosis not present

## 2019-12-12 DIAGNOSIS — M9702XD Periprosthetic fracture around internal prosthetic left hip joint, subsequent encounter: Secondary | ICD-10-CM | POA: Diagnosis not present

## 2019-12-12 DIAGNOSIS — Z96642 Presence of left artificial hip joint: Secondary | ICD-10-CM | POA: Diagnosis not present

## 2019-12-13 DIAGNOSIS — R5383 Other fatigue: Secondary | ICD-10-CM | POA: Diagnosis not present

## 2019-12-14 DIAGNOSIS — M6281 Muscle weakness (generalized): Secondary | ICD-10-CM | POA: Diagnosis not present

## 2019-12-14 DIAGNOSIS — R2681 Unsteadiness on feet: Secondary | ICD-10-CM | POA: Diagnosis not present

## 2019-12-17 DIAGNOSIS — M6281 Muscle weakness (generalized): Secondary | ICD-10-CM | POA: Diagnosis not present

## 2019-12-17 DIAGNOSIS — R2681 Unsteadiness on feet: Secondary | ICD-10-CM | POA: Diagnosis not present

## 2019-12-19 DIAGNOSIS — R2681 Unsteadiness on feet: Secondary | ICD-10-CM | POA: Diagnosis not present

## 2019-12-19 DIAGNOSIS — M6281 Muscle weakness (generalized): Secondary | ICD-10-CM | POA: Diagnosis not present

## 2019-12-21 DIAGNOSIS — R2681 Unsteadiness on feet: Secondary | ICD-10-CM | POA: Diagnosis not present

## 2019-12-21 DIAGNOSIS — M6281 Muscle weakness (generalized): Secondary | ICD-10-CM | POA: Diagnosis not present

## 2019-12-24 DIAGNOSIS — R2681 Unsteadiness on feet: Secondary | ICD-10-CM | POA: Diagnosis not present

## 2019-12-24 DIAGNOSIS — M6281 Muscle weakness (generalized): Secondary | ICD-10-CM | POA: Diagnosis not present

## 2019-12-28 DIAGNOSIS — M6281 Muscle weakness (generalized): Secondary | ICD-10-CM | POA: Diagnosis not present

## 2019-12-28 DIAGNOSIS — R2681 Unsteadiness on feet: Secondary | ICD-10-CM | POA: Diagnosis not present

## 2019-12-31 DIAGNOSIS — M6281 Muscle weakness (generalized): Secondary | ICD-10-CM | POA: Diagnosis not present

## 2019-12-31 DIAGNOSIS — R2681 Unsteadiness on feet: Secondary | ICD-10-CM | POA: Diagnosis not present

## 2020-01-02 DIAGNOSIS — R2681 Unsteadiness on feet: Secondary | ICD-10-CM | POA: Diagnosis not present

## 2020-01-02 DIAGNOSIS — M6281 Muscle weakness (generalized): Secondary | ICD-10-CM | POA: Diagnosis not present

## 2020-01-04 DIAGNOSIS — M6281 Muscle weakness (generalized): Secondary | ICD-10-CM | POA: Diagnosis not present

## 2020-01-04 DIAGNOSIS — R2681 Unsteadiness on feet: Secondary | ICD-10-CM | POA: Diagnosis not present

## 2020-01-07 DIAGNOSIS — R2681 Unsteadiness on feet: Secondary | ICD-10-CM | POA: Diagnosis not present

## 2020-01-07 DIAGNOSIS — M6281 Muscle weakness (generalized): Secondary | ICD-10-CM | POA: Diagnosis not present

## 2020-01-10 DIAGNOSIS — R2681 Unsteadiness on feet: Secondary | ICD-10-CM | POA: Diagnosis not present

## 2020-01-10 DIAGNOSIS — M6281 Muscle weakness (generalized): Secondary | ICD-10-CM | POA: Diagnosis not present

## 2020-01-14 DIAGNOSIS — R2681 Unsteadiness on feet: Secondary | ICD-10-CM | POA: Diagnosis not present

## 2020-01-14 DIAGNOSIS — M6281 Muscle weakness (generalized): Secondary | ICD-10-CM | POA: Diagnosis not present

## 2020-01-16 DIAGNOSIS — R2681 Unsteadiness on feet: Secondary | ICD-10-CM | POA: Diagnosis not present

## 2020-01-16 DIAGNOSIS — M6281 Muscle weakness (generalized): Secondary | ICD-10-CM | POA: Diagnosis not present

## 2020-01-21 DIAGNOSIS — J019 Acute sinusitis, unspecified: Secondary | ICD-10-CM | POA: Diagnosis not present

## 2020-01-21 DIAGNOSIS — I10 Essential (primary) hypertension: Secondary | ICD-10-CM | POA: Diagnosis not present

## 2020-01-21 DIAGNOSIS — R2681 Unsteadiness on feet: Secondary | ICD-10-CM | POA: Diagnosis not present

## 2020-01-21 DIAGNOSIS — M6281 Muscle weakness (generalized): Secondary | ICD-10-CM | POA: Diagnosis not present

## 2020-01-21 DIAGNOSIS — F5101 Primary insomnia: Secondary | ICD-10-CM | POA: Diagnosis not present

## 2020-01-28 DIAGNOSIS — R2681 Unsteadiness on feet: Secondary | ICD-10-CM | POA: Diagnosis not present

## 2020-01-28 DIAGNOSIS — M6281 Muscle weakness (generalized): Secondary | ICD-10-CM | POA: Diagnosis not present

## 2020-01-29 DIAGNOSIS — R2681 Unsteadiness on feet: Secondary | ICD-10-CM | POA: Diagnosis not present

## 2020-01-29 DIAGNOSIS — M6281 Muscle weakness (generalized): Secondary | ICD-10-CM | POA: Diagnosis not present

## 2020-03-31 DIAGNOSIS — L304 Erythema intertrigo: Secondary | ICD-10-CM | POA: Diagnosis not present

## 2020-03-31 DIAGNOSIS — I10 Essential (primary) hypertension: Secondary | ICD-10-CM | POA: Diagnosis not present

## 2020-04-03 DIAGNOSIS — E782 Mixed hyperlipidemia: Secondary | ICD-10-CM | POA: Diagnosis not present

## 2020-04-03 DIAGNOSIS — Z79899 Other long term (current) drug therapy: Secondary | ICD-10-CM | POA: Diagnosis not present

## 2020-04-03 DIAGNOSIS — E039 Hypothyroidism, unspecified: Secondary | ICD-10-CM | POA: Diagnosis not present

## 2020-04-10 DIAGNOSIS — Z79899 Other long term (current) drug therapy: Secondary | ICD-10-CM | POA: Diagnosis not present

## 2020-04-14 DIAGNOSIS — K219 Gastro-esophageal reflux disease without esophagitis: Secondary | ICD-10-CM | POA: Diagnosis not present

## 2020-04-14 DIAGNOSIS — I10 Essential (primary) hypertension: Secondary | ICD-10-CM | POA: Diagnosis not present

## 2020-04-14 DIAGNOSIS — E782 Mixed hyperlipidemia: Secondary | ICD-10-CM | POA: Diagnosis not present

## 2020-04-14 DIAGNOSIS — R7303 Prediabetes: Secondary | ICD-10-CM | POA: Diagnosis not present

## 2020-04-14 DIAGNOSIS — M8000XD Age-related osteoporosis with current pathological fracture, unspecified site, subsequent encounter for fracture with routine healing: Secondary | ICD-10-CM | POA: Diagnosis not present

## 2020-05-15 DIAGNOSIS — R269 Unspecified abnormalities of gait and mobility: Secondary | ICD-10-CM | POA: Diagnosis not present

## 2020-05-15 DIAGNOSIS — M1990 Unspecified osteoarthritis, unspecified site: Secondary | ICD-10-CM | POA: Diagnosis not present

## 2020-05-15 DIAGNOSIS — I10 Essential (primary) hypertension: Secondary | ICD-10-CM | POA: Diagnosis not present

## 2020-06-05 DIAGNOSIS — M6281 Muscle weakness (generalized): Secondary | ICD-10-CM | POA: Diagnosis not present

## 2020-06-05 DIAGNOSIS — R2681 Unsteadiness on feet: Secondary | ICD-10-CM | POA: Diagnosis not present

## 2020-06-09 DIAGNOSIS — M6281 Muscle weakness (generalized): Secondary | ICD-10-CM | POA: Diagnosis not present

## 2020-06-09 DIAGNOSIS — R2681 Unsteadiness on feet: Secondary | ICD-10-CM | POA: Diagnosis not present

## 2020-06-16 DIAGNOSIS — M6281 Muscle weakness (generalized): Secondary | ICD-10-CM | POA: Diagnosis not present

## 2020-06-16 DIAGNOSIS — R2681 Unsteadiness on feet: Secondary | ICD-10-CM | POA: Diagnosis not present

## 2020-06-23 DIAGNOSIS — R197 Diarrhea, unspecified: Secondary | ICD-10-CM | POA: Diagnosis not present

## 2020-06-23 DIAGNOSIS — M8000XD Age-related osteoporosis with current pathological fracture, unspecified site, subsequent encounter for fracture with routine healing: Secondary | ICD-10-CM | POA: Diagnosis not present

## 2020-06-23 DIAGNOSIS — I10 Essential (primary) hypertension: Secondary | ICD-10-CM | POA: Diagnosis not present

## 2020-06-26 DIAGNOSIS — E039 Hypothyroidism, unspecified: Secondary | ICD-10-CM | POA: Diagnosis not present

## 2020-06-26 DIAGNOSIS — M8000XD Age-related osteoporosis with current pathological fracture, unspecified site, subsequent encounter for fracture with routine healing: Secondary | ICD-10-CM | POA: Diagnosis not present

## 2020-07-03 DIAGNOSIS — M6281 Muscle weakness (generalized): Secondary | ICD-10-CM | POA: Diagnosis not present

## 2020-07-03 DIAGNOSIS — R2681 Unsteadiness on feet: Secondary | ICD-10-CM | POA: Diagnosis not present

## 2020-07-07 DIAGNOSIS — R197 Diarrhea, unspecified: Secondary | ICD-10-CM | POA: Diagnosis not present

## 2020-07-07 DIAGNOSIS — I10 Essential (primary) hypertension: Secondary | ICD-10-CM | POA: Diagnosis not present

## 2020-07-07 DIAGNOSIS — M8000XD Age-related osteoporosis with current pathological fracture, unspecified site, subsequent encounter for fracture with routine healing: Secondary | ICD-10-CM | POA: Diagnosis not present

## 2020-07-07 DIAGNOSIS — K5901 Slow transit constipation: Secondary | ICD-10-CM | POA: Diagnosis not present

## 2020-07-07 DIAGNOSIS — K219 Gastro-esophageal reflux disease without esophagitis: Secondary | ICD-10-CM | POA: Diagnosis not present

## 2020-08-07 DIAGNOSIS — D509 Iron deficiency anemia, unspecified: Secondary | ICD-10-CM | POA: Diagnosis not present

## 2020-08-07 DIAGNOSIS — I10 Essential (primary) hypertension: Secondary | ICD-10-CM | POA: Diagnosis not present

## 2020-08-18 DIAGNOSIS — E782 Mixed hyperlipidemia: Secondary | ICD-10-CM | POA: Diagnosis not present

## 2020-08-18 DIAGNOSIS — I10 Essential (primary) hypertension: Secondary | ICD-10-CM | POA: Diagnosis not present

## 2020-08-18 DIAGNOSIS — Z7901 Long term (current) use of anticoagulants: Secondary | ICD-10-CM | POA: Diagnosis not present

## 2020-08-18 DIAGNOSIS — K529 Noninfective gastroenteritis and colitis, unspecified: Secondary | ICD-10-CM | POA: Diagnosis not present

## 2020-09-08 DIAGNOSIS — R197 Diarrhea, unspecified: Secondary | ICD-10-CM | POA: Diagnosis not present

## 2020-09-08 DIAGNOSIS — I10 Essential (primary) hypertension: Secondary | ICD-10-CM | POA: Diagnosis not present

## 2020-09-08 DIAGNOSIS — S72002S Fracture of unspecified part of neck of left femur, sequela: Secondary | ICD-10-CM | POA: Diagnosis not present

## 2020-09-08 DIAGNOSIS — K5909 Other constipation: Secondary | ICD-10-CM | POA: Diagnosis not present

## 2020-09-22 DIAGNOSIS — K582 Mixed irritable bowel syndrome: Secondary | ICD-10-CM | POA: Diagnosis not present

## 2020-09-22 DIAGNOSIS — E782 Mixed hyperlipidemia: Secondary | ICD-10-CM | POA: Diagnosis not present

## 2020-09-22 DIAGNOSIS — K219 Gastro-esophageal reflux disease without esophagitis: Secondary | ICD-10-CM | POA: Diagnosis not present

## 2020-09-22 DIAGNOSIS — I10 Essential (primary) hypertension: Secondary | ICD-10-CM | POA: Diagnosis not present

## 2020-09-22 DIAGNOSIS — Z7901 Long term (current) use of anticoagulants: Secondary | ICD-10-CM | POA: Diagnosis not present

## 2020-10-20 DIAGNOSIS — I10 Essential (primary) hypertension: Secondary | ICD-10-CM | POA: Diagnosis not present

## 2020-10-20 DIAGNOSIS — F5101 Primary insomnia: Secondary | ICD-10-CM | POA: Diagnosis not present

## 2020-10-20 DIAGNOSIS — K582 Mixed irritable bowel syndrome: Secondary | ICD-10-CM | POA: Diagnosis not present

## 2020-10-20 DIAGNOSIS — B350 Tinea barbae and tinea capitis: Secondary | ICD-10-CM | POA: Diagnosis not present

## 2020-11-17 DIAGNOSIS — I10 Essential (primary) hypertension: Secondary | ICD-10-CM | POA: Diagnosis not present

## 2020-11-17 DIAGNOSIS — K582 Mixed irritable bowel syndrome: Secondary | ICD-10-CM | POA: Diagnosis not present

## 2020-11-17 DIAGNOSIS — I872 Venous insufficiency (chronic) (peripheral): Secondary | ICD-10-CM | POA: Diagnosis not present

## 2020-11-17 DIAGNOSIS — B354 Tinea corporis: Secondary | ICD-10-CM | POA: Diagnosis not present

## 2020-12-15 DIAGNOSIS — I10 Essential (primary) hypertension: Secondary | ICD-10-CM | POA: Diagnosis not present

## 2020-12-15 DIAGNOSIS — E782 Mixed hyperlipidemia: Secondary | ICD-10-CM | POA: Diagnosis not present

## 2020-12-15 DIAGNOSIS — M8000XD Age-related osteoporosis with current pathological fracture, unspecified site, subsequent encounter for fracture with routine healing: Secondary | ICD-10-CM | POA: Diagnosis not present

## 2020-12-15 DIAGNOSIS — L209 Atopic dermatitis, unspecified: Secondary | ICD-10-CM | POA: Diagnosis not present

## 2020-12-25 DIAGNOSIS — L218 Other seborrheic dermatitis: Secondary | ICD-10-CM | POA: Diagnosis not present

## 2020-12-25 DIAGNOSIS — L821 Other seborrheic keratosis: Secondary | ICD-10-CM | POA: Diagnosis not present

## 2020-12-25 DIAGNOSIS — R208 Other disturbances of skin sensation: Secondary | ICD-10-CM | POA: Diagnosis not present

## 2020-12-25 DIAGNOSIS — L918 Other hypertrophic disorders of the skin: Secondary | ICD-10-CM | POA: Diagnosis not present

## 2020-12-25 DIAGNOSIS — L2084 Intrinsic (allergic) eczema: Secondary | ICD-10-CM | POA: Diagnosis not present

## 2021-01-15 DIAGNOSIS — L309 Dermatitis, unspecified: Secondary | ICD-10-CM | POA: Diagnosis not present

## 2021-01-15 DIAGNOSIS — L218 Other seborrheic dermatitis: Secondary | ICD-10-CM | POA: Diagnosis not present

## 2021-01-26 DIAGNOSIS — F5101 Primary insomnia: Secondary | ICD-10-CM | POA: Diagnosis not present

## 2021-01-26 DIAGNOSIS — I10 Essential (primary) hypertension: Secondary | ICD-10-CM | POA: Diagnosis not present

## 2021-01-26 DIAGNOSIS — E782 Mixed hyperlipidemia: Secondary | ICD-10-CM | POA: Diagnosis not present

## 2021-01-26 DIAGNOSIS — M8000XD Age-related osteoporosis with current pathological fracture, unspecified site, subsequent encounter for fracture with routine healing: Secondary | ICD-10-CM | POA: Diagnosis not present

## 2021-01-26 DIAGNOSIS — K219 Gastro-esophageal reflux disease without esophagitis: Secondary | ICD-10-CM | POA: Diagnosis not present

## 2021-02-12 DIAGNOSIS — L309 Dermatitis, unspecified: Secondary | ICD-10-CM | POA: Diagnosis not present

## 2021-02-20 DIAGNOSIS — M1990 Unspecified osteoarthritis, unspecified site: Secondary | ICD-10-CM | POA: Diagnosis not present

## 2021-02-20 DIAGNOSIS — M810 Age-related osteoporosis without current pathological fracture: Secondary | ICD-10-CM | POA: Diagnosis not present

## 2021-02-26 ENCOUNTER — Telehealth: Payer: Self-pay

## 2021-02-26 DIAGNOSIS — M810 Age-related osteoporosis without current pathological fracture: Secondary | ICD-10-CM | POA: Diagnosis not present

## 2021-02-26 DIAGNOSIS — I872 Venous insufficiency (chronic) (peripheral): Secondary | ICD-10-CM | POA: Diagnosis not present

## 2021-02-26 DIAGNOSIS — K219 Gastro-esophageal reflux disease without esophagitis: Secondary | ICD-10-CM | POA: Diagnosis not present

## 2021-02-26 DIAGNOSIS — M1990 Unspecified osteoarthritis, unspecified site: Secondary | ICD-10-CM | POA: Diagnosis not present

## 2021-02-26 DIAGNOSIS — F5101 Primary insomnia: Secondary | ICD-10-CM | POA: Diagnosis not present

## 2021-02-26 DIAGNOSIS — E782 Mixed hyperlipidemia: Secondary | ICD-10-CM | POA: Diagnosis not present

## 2021-02-26 DIAGNOSIS — I10 Essential (primary) hypertension: Secondary | ICD-10-CM | POA: Diagnosis not present

## 2021-02-26 NOTE — Telephone Encounter (Signed)
Copied from CRM [id number]. Topic: General - Inquiry >> Feb 26, 2021  3:01 PM Daphine Deutscher D wrote: Reason for CRM: Pt's daughter Tobey Bride called asking if her mom has had any TB test in her files and if so when.  CB#  [phone]

## 2021-02-26 NOTE — Telephone Encounter (Signed)
I reviewed patient's chart and didn't find any past TB test. I called patients daughter and advised her.

## 2021-03-16 DIAGNOSIS — E039 Hypothyroidism, unspecified: Secondary | ICD-10-CM | POA: Diagnosis not present

## 2021-03-16 DIAGNOSIS — E782 Mixed hyperlipidemia: Secondary | ICD-10-CM | POA: Diagnosis not present

## 2021-03-16 DIAGNOSIS — I10 Essential (primary) hypertension: Secondary | ICD-10-CM | POA: Diagnosis not present

## 2021-03-16 DIAGNOSIS — Z79899 Other long term (current) drug therapy: Secondary | ICD-10-CM | POA: Diagnosis not present

## 2021-03-17 DIAGNOSIS — M810 Age-related osteoporosis without current pathological fracture: Secondary | ICD-10-CM | POA: Diagnosis not present

## 2021-03-17 DIAGNOSIS — M1990 Unspecified osteoarthritis, unspecified site: Secondary | ICD-10-CM | POA: Diagnosis not present

## 2021-04-10 DIAGNOSIS — I872 Venous insufficiency (chronic) (peripheral): Secondary | ICD-10-CM | POA: Diagnosis not present

## 2021-06-16 DIAGNOSIS — D649 Anemia, unspecified: Secondary | ICD-10-CM | POA: Diagnosis not present

## 2021-06-16 DIAGNOSIS — E785 Hyperlipidemia, unspecified: Secondary | ICD-10-CM | POA: Diagnosis not present

## 2021-06-16 DIAGNOSIS — I10 Essential (primary) hypertension: Secondary | ICD-10-CM | POA: Diagnosis not present

## 2021-06-16 DIAGNOSIS — K219 Gastro-esophageal reflux disease without esophagitis: Secondary | ICD-10-CM | POA: Diagnosis not present

## 2021-10-20 ENCOUNTER — Ambulatory Visit: Payer: Self-pay

## 2021-10-20 NOTE — Patient Outreach (Signed)
  Care Coordination   10/20/2021 Name: Erica Smith MRN: [id number] DOB: 03/06/1940   Care Coordination Outreach Attempts:  An unsuccessful telephone outreach was attempted today to offer the patient information about available care coordination services as a benefit of their health plan.   Follow Up Plan:  Additional outreach attempts will be made to offer the patient care coordination information and services.   Encounter Outcome:  No Answer  Care Coordination Interventions Activated:  No   Care Coordination Interventions:  No, not indicated    Alto Denver RN, MSN, CCM Community Care Coordinator [facility]  [facility] Mobile: [phone]

## 2021-10-26 DIAGNOSIS — K219 Gastro-esophageal reflux disease without esophagitis: Secondary | ICD-10-CM | POA: Diagnosis not present

## 2021-10-26 DIAGNOSIS — I10 Essential (primary) hypertension: Secondary | ICD-10-CM | POA: Diagnosis not present

## 2021-10-26 DIAGNOSIS — M810 Age-related osteoporosis without current pathological fracture: Secondary | ICD-10-CM | POA: Diagnosis not present

## 2021-10-26 DIAGNOSIS — E785 Hyperlipidemia, unspecified: Secondary | ICD-10-CM | POA: Diagnosis not present

## 2021-10-26 DIAGNOSIS — Z23 Encounter for immunization: Secondary | ICD-10-CM | POA: Diagnosis not present

## 2021-10-26 DIAGNOSIS — K5900 Constipation, unspecified: Secondary | ICD-10-CM | POA: Diagnosis not present

## 2021-11-30 DIAGNOSIS — J209 Acute bronchitis, unspecified: Secondary | ICD-10-CM | POA: Diagnosis not present

## 2021-11-30 DIAGNOSIS — Z20822 Contact with and (suspected) exposure to covid-19: Secondary | ICD-10-CM | POA: Diagnosis not present

## 2021-11-30 DIAGNOSIS — R918 Other nonspecific abnormal finding of lung field: Secondary | ICD-10-CM | POA: Diagnosis not present

## 2022-04-27 DIAGNOSIS — L853 Xerosis cutis: Secondary | ICD-10-CM | POA: Diagnosis not present

## 2022-04-27 DIAGNOSIS — I872 Venous insufficiency (chronic) (peripheral): Secondary | ICD-10-CM | POA: Diagnosis not present

## 2022-07-27 DIAGNOSIS — L820 Inflamed seborrheic keratosis: Secondary | ICD-10-CM | POA: Diagnosis not present

## 2022-07-27 DIAGNOSIS — D485 Neoplasm of uncertain behavior of skin: Secondary | ICD-10-CM | POA: Diagnosis not present

## 2022-07-27 DIAGNOSIS — L853 Xerosis cutis: Secondary | ICD-10-CM | POA: Diagnosis not present

## 2022-07-27 DIAGNOSIS — L821 Other seborrheic keratosis: Secondary | ICD-10-CM | POA: Diagnosis not present

## 2022-07-27 DIAGNOSIS — L298 Other pruritus: Secondary | ICD-10-CM | POA: Diagnosis not present

## 2022-07-27 DIAGNOSIS — L449 Papulosquamous disorder, unspecified: Secondary | ICD-10-CM | POA: Diagnosis not present

## 2022-07-27 DIAGNOSIS — L570 Actinic keratosis: Secondary | ICD-10-CM | POA: Diagnosis not present

## 2022-07-27 DIAGNOSIS — L814 Other melanin hyperpigmentation: Secondary | ICD-10-CM | POA: Diagnosis not present

## 2022-07-27 DIAGNOSIS — D225 Melanocytic nevi of trunk: Secondary | ICD-10-CM | POA: Diagnosis not present

## 2022-10-19 DIAGNOSIS — E559 Vitamin D deficiency, unspecified: Secondary | ICD-10-CM | POA: Diagnosis not present

## 2022-10-19 DIAGNOSIS — R8290 Unspecified abnormal findings in urine: Secondary | ICD-10-CM | POA: Diagnosis not present

## 2022-10-19 DIAGNOSIS — E785 Hyperlipidemia, unspecified: Secondary | ICD-10-CM | POA: Diagnosis not present

## 2022-10-19 DIAGNOSIS — I10 Essential (primary) hypertension: Secondary | ICD-10-CM | POA: Diagnosis not present

## 2022-10-26 DIAGNOSIS — R7401 Elevation of levels of liver transaminase levels: Secondary | ICD-10-CM | POA: Diagnosis not present

## 2022-10-26 DIAGNOSIS — E785 Hyperlipidemia, unspecified: Secondary | ICD-10-CM | POA: Diagnosis not present

## 2022-10-26 DIAGNOSIS — I10 Essential (primary) hypertension: Secondary | ICD-10-CM | POA: Diagnosis not present

## 2022-10-26 DIAGNOSIS — K219 Gastro-esophageal reflux disease without esophagitis: Secondary | ICD-10-CM | POA: Diagnosis not present

## 2022-10-26 DIAGNOSIS — M810 Age-related osteoporosis without current pathological fracture: Secondary | ICD-10-CM | POA: Diagnosis not present

## 2023-01-03 DIAGNOSIS — R350 Frequency of micturition: Secondary | ICD-10-CM | POA: Diagnosis not present

## 2023-01-03 DIAGNOSIS — K5900 Constipation, unspecified: Secondary | ICD-10-CM | POA: Diagnosis not present

## 2023-01-04 DIAGNOSIS — R350 Frequency of micturition: Secondary | ICD-10-CM | POA: Diagnosis not present
